# Patient Record
Sex: Female | Born: 1980 | Race: White | Hispanic: No | Smoking: Current every day smoker
Health system: Southern US, Community
[De-identification: ages and names within clinical notes are randomized; demographics above are authoritative.]

## PROBLEM LIST (undated history)

## (undated) DIAGNOSIS — C801 Malignant (primary) neoplasm, unspecified: Secondary | ICD-10-CM

## (undated) DIAGNOSIS — R009 Unspecified abnormalities of heart beat: Secondary | ICD-10-CM

## (undated) DIAGNOSIS — A0472 Enterocolitis due to Clostridium difficile, not specified as recurrent: Secondary | ICD-10-CM

## (undated) DIAGNOSIS — F319 Bipolar disorder, unspecified: Secondary | ICD-10-CM

## (undated) DIAGNOSIS — F419 Anxiety disorder, unspecified: Secondary | ICD-10-CM

## (undated) HISTORY — DX: Enterocolitis due to Clostridium difficile, not specified as recurrent: A04.72

## (undated) HISTORY — PX: ABDOMINAL HYSTERECTOMY: SHX81

## (undated) HISTORY — PX: BACK SURGERY: SHX140

---

## 2005-07-20 ENCOUNTER — Ambulatory Visit (HOSPITAL_COMMUNITY): Admission: RE | Admit: 2005-07-20 | Discharge: 2005-07-21 | Payer: Self-pay | Admitting: Neurosurgery

## 2006-05-25 ENCOUNTER — Ambulatory Visit (HOSPITAL_COMMUNITY): Admission: RE | Admit: 2006-05-25 | Discharge: 2006-05-26 | Payer: Self-pay | Admitting: Neurosurgery

## 2009-09-10 ENCOUNTER — Inpatient Hospital Stay (HOSPITAL_COMMUNITY): Admission: RE | Admit: 2009-09-10 | Discharge: 2009-09-11 | Payer: Self-pay | Admitting: Neurosurgery

## 2009-09-15 ENCOUNTER — Inpatient Hospital Stay (HOSPITAL_COMMUNITY): Admission: EM | Admit: 2009-09-15 | Discharge: 2009-09-17 | Payer: Self-pay | Admitting: Emergency Medicine

## 2009-09-18 ENCOUNTER — Inpatient Hospital Stay (HOSPITAL_COMMUNITY): Admission: AD | Admit: 2009-09-18 | Discharge: 2009-09-24 | Payer: Self-pay | Admitting: Neurosurgery

## 2009-12-06 ENCOUNTER — Inpatient Hospital Stay (HOSPITAL_COMMUNITY): Admission: AD | Admit: 2009-12-06 | Discharge: 2009-12-12 | Payer: Self-pay | Admitting: Neurosurgery

## 2009-12-09 ENCOUNTER — Ambulatory Visit: Payer: Self-pay | Admitting: Internal Medicine

## 2009-12-12 ENCOUNTER — Encounter: Payer: Self-pay | Admitting: Internal Medicine

## 2009-12-20 ENCOUNTER — Telehealth: Payer: Self-pay | Admitting: Internal Medicine

## 2009-12-20 ENCOUNTER — Emergency Department (HOSPITAL_COMMUNITY): Admission: EM | Admit: 2009-12-20 | Discharge: 2009-12-21 | Payer: Self-pay | Admitting: Emergency Medicine

## 2009-12-31 ENCOUNTER — Encounter: Payer: Self-pay | Admitting: Infectious Disease

## 2010-01-08 ENCOUNTER — Ambulatory Visit: Payer: Self-pay | Admitting: Internal Medicine

## 2010-01-08 DIAGNOSIS — Z8541 Personal history of malignant neoplasm of cervix uteri: Secondary | ICD-10-CM | POA: Insufficient documentation

## 2010-01-08 DIAGNOSIS — D649 Anemia, unspecified: Secondary | ICD-10-CM

## 2010-01-08 DIAGNOSIS — F329 Major depressive disorder, single episode, unspecified: Secondary | ICD-10-CM

## 2010-01-08 DIAGNOSIS — IMO0002 Reserved for concepts with insufficient information to code with codable children: Secondary | ICD-10-CM

## 2010-01-08 DIAGNOSIS — T8140XA Infection following a procedure, unspecified, initial encounter: Secondary | ICD-10-CM

## 2010-01-08 DIAGNOSIS — Z9889 Other specified postprocedural states: Secondary | ICD-10-CM

## 2010-01-08 DIAGNOSIS — F17200 Nicotine dependence, unspecified, uncomplicated: Secondary | ICD-10-CM

## 2010-01-23 ENCOUNTER — Telehealth: Payer: Self-pay | Admitting: Internal Medicine

## 2010-02-13 ENCOUNTER — Encounter: Payer: Self-pay | Admitting: Internal Medicine

## 2010-11-04 NOTE — Consult Note (Signed)
Summary: Consultation Report  Consultation Report   Imported By: Florinda Marker 03/17/2010 10:13:07  _____________________________________________________________________  External Attachment:    Type:   Image     Comment:   External Document

## 2010-11-04 NOTE — Progress Notes (Signed)
Summary: Pt. to go to [facility]. for [facility] evaluation  Phone Note Other Incoming   Caller: Dr. Orvan Falconer Summary of Call: Pt. reported to Dr. Orvan Falconer that her [facility] was not flushing after she had given herself her morning dose of vancomycin.  Dr. Orvan Falconer requested that this RN follow-up to see whether the pt. needed to come to the ED to have her [facility] line evaluated.  Jennet Maduro RN  December 20, 2009 12:01 PM  RN called the pt. who shared that same information that Dr. Orvan Falconer had told this RN.  RN told the pt. that she would contact the [facility], Stanton Kidney [phone]).   Jennet Maduro RN  December 20, 2009 12:01 PM  Phone call to Debra,RN.  Stanton Kidney actually made a visit to the pt's home this AM.  Stanton Kidney attempted to flush the [facility] without success.  Stanton Kidney believes that the pt. may not be flushing the [facility] before and after IV Vanc administration.  Pt. to go to the ED at [facility] to have the [facility] line evaluated, either declot or replacement.  Stanton Kidney, [facility] to call pt with this informaiton. Jennet Maduro RN  December 20, 2009 12:06 PM

## 2010-11-04 NOTE — Miscellaneous (Signed)
Summary: HIPAA Restrictions  HIPAA Restrictions   Imported By: Florinda Marker 01/09/2010 15:26:21  _____________________________________________________________________  External Attachment:    Type:   Image     Comment:   External Document

## 2010-11-04 NOTE — Miscellaneous (Signed)
Summary: [facility]: Orders  [facility]: Orders   Imported By: Florinda Marker 02/14/2010 16:43:12  _____________________________________________________________________  External Attachment:    Type:   Image     Comment:   External Document

## 2010-11-04 NOTE — Assessment & Plan Note (Signed)
Summary: [facility] NEED CHART/MRSA LUMBAR WOUND INF/KM   CC:  [facility].  History of Present Illness: [name] is in for her hospital follow-up visit.  She has degenerative disk disease and has undergone multiple lumbar surgeries.  She underwent a bilateral L5 laminectomy in December of 2010.  She required a dural tear repair during that procedure.  She did well until February when she began to have recurrent back pain and swelling of her wound.  She was readmitted and underwent incision and drainage on March 4.  Operative cultures grew MRSA.  She was discharged home on IV vancomycin through a PICC.  The line became clotted but this was cleared at [facility] and she's had no further problems with her IV vancomycin or the PICC.  She is feeling much better with decreased pain.  Preventive Screening-Counseling & Management  Alcohol-Tobacco     Smoking Status: current     Smoking Cessation Counseling: yes     Packs/Day: 0.5     Year Started: 1999  Caffeine-Diet-Exercise     Caffeine use/day: yes     Does Patient Exercise: no  Safety-Violence-Falls     Seat Belt Use: yes   Current Allergies (reviewed today): ! CODEINE ! MORPHINE ! DURAGESIC-100 (FENTANYL) ! STEROIDS ! VICODIN Past History:  Past Medical History: Cervical cancer, hx of Depression Anemia-NOS  Past Surgical History: Cholecystectomy Hysterectomy Lumbar laminectomy  Vital Signs:  Patient profile:   30 year old female Height:      67 inches (170.18 cm) Weight:      148.75 pounds (67.61 kg) BMI:     23.38 Temp:     97.9 degrees F (36.61 degrees C) oral Pulse rate:   80 / minute BP sitting:   117 / 78  (right arm) Cuff size:   regular  Vitals Entered By: Jennet Maduro RN (January 08, 2010 2:57 PM) CC: [facility] Is Patient Diabetic? No Pain Assessment Patient in pain? yes     Location: lower back Intensity: 5 Type: burning, sharp Onset of pain  Constant Nutritional Status BMI of 19 -24 =  normal Nutritional Status Detail appetite "OK"  Have you ever been in a relationship where you felt threatened, hurt or afraid?not asked   Does patient need assistance? Functional Status Self care Ambulation Normal   Physical Exam  General:  alert and overweight-appearing.  she appears much more comfortable than when in the hospital Msk:  her lumbar incision is healing well.  It is no longer large enough to receive any packing.   Impression & Recommendations:  Problem # 1:  OTHER POSTOPERATIVE INFECTION NEC (ICD-998.59) She is improving on IV vancomycin. She is currently in the 31 of therapy and will receive a full 6 weeks of treatment. Orders: Est. Patient Level III (16109)  Medications Added to Medication List This Visit: 1)  Vancomycin Hcl 1000 Mg Solr (Vancomycin hcl) .... Iv per protocol  Patient Instructions: 1)  Please schedule a follow-up appointment on 4/18.

## 2010-11-04 NOTE — Progress Notes (Signed)
Summary: Genevieve Norlander RN unable to meet with pt. needing PIC orders   Phone Note Other Incoming   Caller: Candyce Churn RN [phone] Summary of Call: [facility] RN unable to see or talk with pt. since Monday.  HHRN reported that there is a car parked in the driveway during the day and that the pt. states she has to pick up her father after work at 3:30 pm daily.  HHRN asked what Dr. Orvan Falconer wanted to do about the pt's PIC line.  HHRN has not been able to change the pt's PIC dressing this week due to the pt. not being home when [facility] came to the pt's home.  RN advised the [facility] to continue to attempt to meet the pt. and change the [facility] line dressing. This office will contact [facility] after obtaining orders from Dr. Orvan Falconer on Monday, April 25. Jennet Maduro RN  January 23, 2010 5:11 PM   Follow-up for Phone Call        She has missed two recent appointments and is not following IV vanco orders. The PICC needs to be puled and vanco stopped. Follow-up by: Cliffton Asters MD,  January 27, 2010 9:45 AM     Appended Document: Genevieve Norlander RN unable to meet with pt. needing PIC orders  Dr. Orvan Falconer asked that pt. be scheduled on Tuesday, April 26 @ 1500. RN contacted pt.  Pt. stated  that she would be able to keep appt.   Appended Document: Genevieve Norlander RN unable to meet with pt. needing PIC orders  Pt. unable to keep appt. today.  Dr. Orvan Falconer asked that the IV ABX be stopped and PIC be removed.  Phone call to [facility] RN.  Order given.

## 2010-12-28 LAB — COMPREHENSIVE METABOLIC PANEL
ALT: 8 U/L (ref 0–35)
AST: 16 U/L (ref 0–37)
Albumin: 2.3 g/dL — ABNORMAL LOW (ref 3.5–5.2)
Alkaline Phosphatase: 62 U/L (ref 39–117)
BUN: 2 mg/dL — ABNORMAL LOW (ref 6–23)
CO2: 33 mEq/L — ABNORMAL HIGH (ref 19–32)
Calcium: 8.8 mg/dL (ref 8.4–10.5)
Chloride: 104 mEq/L (ref 96–112)
Creatinine, Ser: 0.66 mg/dL (ref 0.4–1.2)
GFR calc Af Amer: >60 mL/min (ref >60)
GFR calc non Af Amer: >60 mL/min (ref >60)
Glucose, Bld: 86 mg/dL (ref 70–99)
Potassium: 4.3 mEq/L (ref 3.5–5.1)
Sodium: 143 mEq/L (ref 135–145)
Total Bilirubin: 0.2 mg/dL — ABNORMAL LOW (ref 0.3–1.2)
Total Protein: 6.5 g/dL (ref 6.0–8.3)

## 2010-12-28 LAB — WOUND CULTURE

## 2010-12-28 LAB — CBC
HCT: 29.1 % — ABNORMAL LOW (ref 36.0–46.0)
HCT: 30.2 % — ABNORMAL LOW (ref 36.0–46.0)
Hemoglobin: 10.1 g/dL — ABNORMAL LOW (ref 12.0–15.0)
Hemoglobin: 9.9 g/dL — ABNORMAL LOW (ref 12.0–15.0)
MCHC: 33.5 g/dL (ref 30.0–36.0)
MCHC: 33.9 g/dL (ref 30.0–36.0)
MCV: 83.4 fL (ref 78.0–100.0)
MCV: 84.5 fL (ref 78.0–100.0)
Platelets: 241 10*3/uL (ref 150–400)
Platelets: 296 10*3/uL (ref 150–400)
RBC: 3.49 MIL/uL — ABNORMAL LOW (ref 3.87–5.11)
RBC: 3.57 MIL/uL — ABNORMAL LOW (ref 3.87–5.11)
RDW: 13.5 % (ref 11.5–15.5)
RDW: 13.5 % (ref 11.5–15.5)
WBC: 4.8 10*3/uL (ref 4.0–10.5)
WBC: 7.9 10*3/uL (ref 4.0–10.5)

## 2010-12-28 LAB — DIFFERENTIAL
Basophils Absolute: 0.1 10*3/uL (ref 0.0–0.1)
Basophils Relative: 1 % (ref 0–1)
Eosinophils Absolute: 0 10*3/uL (ref 0.0–0.7)
Eosinophils Relative: 0 % (ref 0–5)
Lymphocytes Relative: 18 % (ref 12–46)
Lymphs Abs: 1.4 10*3/uL (ref 0.7–4.0)
Monocytes Absolute: 0.6 10*3/uL (ref 0.1–1.0)
Monocytes Relative: 8 % (ref 3–12)
Neutro Abs: 5.7 10*3/uL (ref 1.7–7.7)
Neutrophils Relative %: 73 % (ref 43–77)

## 2010-12-28 LAB — T4, FREE: Free T4: 1.16 ng/dL (ref 0.80–1.80)

## 2010-12-28 LAB — ANAEROBIC CULTURE

## 2010-12-28 LAB — SEDIMENTATION RATE: Sed Rate: 112 mm/hr — ABNORMAL HIGH (ref 0–22)

## 2010-12-28 LAB — VANCOMYCIN, TROUGH: Vancomycin Tr: 12.3 ug/mL (ref 10.0–20.0)

## 2010-12-28 LAB — TSH: TSH: 2.315 u[IU]/mL (ref 0.350–4.500)

## 2011-01-06 LAB — DIFFERENTIAL
Basophils Absolute: 0 10*3/uL (ref 0.0–0.1)
Basophils Relative: 1 % (ref 0–1)
Eosinophils Absolute: 0.2 10*3/uL (ref 0.0–0.7)
Eosinophils Relative: 4 % (ref 0–5)
Lymphocytes Relative: 37 % (ref 12–46)
Lymphs Abs: 1.7 10*3/uL (ref 0.7–4.0)
Monocytes Absolute: 0.4 10*3/uL (ref 0.1–1.0)
Monocytes Relative: 8 % (ref 3–12)
Neutro Abs: 2.4 10*3/uL (ref 1.7–7.7)
Neutrophils Relative %: 51 % (ref 43–77)

## 2011-01-06 LAB — CBC
HCT: 36.4 % (ref 36.0–46.0)
HCT: 38.3 % (ref 36.0–46.0)
Hemoglobin: 12.4 g/dL (ref 12.0–15.0)
Hemoglobin: 13.2 g/dL (ref 12.0–15.0)
MCHC: 34.1 g/dL (ref 30.0–36.0)
MCHC: 34.6 g/dL (ref 30.0–36.0)
MCV: 90.4 fL (ref 78.0–100.0)
MCV: 90.9 fL (ref 78.0–100.0)
Platelets: 149 10*3/uL — ABNORMAL LOW (ref 150–400)
Platelets: 190 10*3/uL (ref 150–400)
RBC: 4 MIL/uL (ref 3.87–5.11)
RBC: 4.23 MIL/uL (ref 3.87–5.11)
RDW: 12.7 % (ref 11.5–15.5)
RDW: 12.8 % (ref 11.5–15.5)
WBC: 4.6 10*3/uL (ref 4.0–10.5)
WBC: 5.3 10*3/uL (ref 4.0–10.5)

## 2011-01-06 LAB — BASIC METABOLIC PANEL
BUN: 5 mg/dL — ABNORMAL LOW (ref 6–23)
CO2: 26 mEq/L (ref 19–32)
Calcium: 9 mg/dL (ref 8.4–10.5)
Chloride: 107 mEq/L (ref 96–112)
Creatinine, Ser: 0.71 mg/dL (ref 0.4–1.2)
GFR calc Af Amer: >60 mL/min (ref >60)
GFR calc non Af Amer: >60 mL/min (ref >60)
Glucose, Bld: 89 mg/dL (ref 70–99)
Potassium: 3.4 mEq/L — ABNORMAL LOW (ref 3.5–5.1)
Sodium: 140 mEq/L (ref 135–145)

## 2011-01-06 LAB — POCT I-STAT 4, (NA,K, GLUC, HGB,HCT)
Glucose, Bld: 82 mg/dL (ref 70–99)
HCT: 45 % (ref 36.0–46.0)
Hemoglobin: 15.3 g/dL — ABNORMAL HIGH (ref 12.0–15.0)
Potassium: 3.9 mEq/L (ref 3.5–5.1)
Sodium: 142 mEq/L (ref 135–145)

## 2011-01-06 LAB — MRSA PCR SCREENING: MRSA by PCR: NEGATIVE

## 2011-02-03 ENCOUNTER — Other Ambulatory Visit: Payer: Self-pay | Admitting: Neurosurgery

## 2011-02-03 DIAGNOSIS — M549 Dorsalgia, unspecified: Secondary | ICD-10-CM

## 2011-02-03 DIAGNOSIS — M5410 Radiculopathy, site unspecified: Secondary | ICD-10-CM

## 2011-02-10 ENCOUNTER — Ambulatory Visit
Admission: RE | Admit: 2011-02-10 | Discharge: 2011-02-10 | Disposition: A | Discharge status: Home / Self Care | Payer: Medicaid Other | Source: Ambulatory Visit | Attending: Neurosurgery | Admitting: Neurosurgery

## 2011-02-10 DIAGNOSIS — M549 Dorsalgia, unspecified: Secondary | ICD-10-CM

## 2011-02-10 DIAGNOSIS — M5410 Radiculopathy, site unspecified: Secondary | ICD-10-CM

## 2011-02-12 ENCOUNTER — Encounter (HOSPITAL_COMMUNITY)
Admission: RE | Admit: 2011-02-12 | Discharge: 2011-02-12 | Disposition: A | Discharge status: Home / Self Care | Payer: Medicaid Other | Source: Ambulatory Visit | Attending: Neurosurgery | Admitting: Neurosurgery

## 2011-02-12 LAB — CBC
MCH: 27.6 pg (ref 26.0–34.0)
MCV: 82.5 fL (ref 78.0–100.0)
Platelets: 232 10*3/uL (ref 150–400)
RDW: 13.4 % (ref 11.5–15.5)
WBC: 4.3 10*3/uL (ref 4.0–10.5)

## 2011-02-13 ENCOUNTER — Inpatient Hospital Stay (HOSPITAL_COMMUNITY)
Admission: RE | Admit: 2011-02-13 | Discharge: 2011-02-19 | DRG: 460 | Disposition: A | Discharge status: Home / Self Care | Payer: Medicaid Other | Source: Ambulatory Visit | Attending: Neurosurgery | Admitting: Neurosurgery

## 2011-02-13 ENCOUNTER — Inpatient Hospital Stay (HOSPITAL_COMMUNITY): Payer: Medicaid Other

## 2011-02-13 DIAGNOSIS — M51379 Other intervertebral disc degeneration, lumbosacral region without mention of lumbar back pain or lower extremity pain: Secondary | ICD-10-CM | POA: Diagnosis present

## 2011-02-13 DIAGNOSIS — IMO0001 Reserved for inherently not codable concepts without codable children: Secondary | ICD-10-CM | POA: Diagnosis present

## 2011-02-13 DIAGNOSIS — Z9889 Other specified postprocedural states: Secondary | ICD-10-CM

## 2011-02-13 DIAGNOSIS — M5137 Other intervertebral disc degeneration, lumbosacral region: Secondary | ICD-10-CM | POA: Diagnosis present

## 2011-02-13 DIAGNOSIS — Z01812 Encounter for preprocedural laboratory examination: Secondary | ICD-10-CM

## 2011-02-13 DIAGNOSIS — M5126 Other intervertebral disc displacement, lumbar region: Principal | ICD-10-CM | POA: Diagnosis present

## 2011-02-13 DIAGNOSIS — F17200 Nicotine dependence, unspecified, uncomplicated: Secondary | ICD-10-CM | POA: Diagnosis present

## 2011-02-17 NOTE — Op Note (Signed)
[facility], Gloria Hawkins          ACCOUNT NO.:  [id number]  MEDICAL RECORD NO.:  [id number]           PATIENT TYPE:  I  LOCATION:  3039                         FACILITY:  [facility]  PHYSICIAN:  Hilda Lias, M.D.   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  PROCEDURE:  Bilateral L4-L5 diskectomy, facetectomy.  Interbody fusion with cages, pedicle screws from L4 to L5, posterolateral arthrodesis with Osteocel and autograft plus bone marrow aspiration, Cell Saver, C- arm.  CLINICAL HISTORY:  The patient is a 30 year old female who in the past underwent two lumbar procedures.  Nevertheless, since December 2011, she is complaining of back pain, worsened to both legs.  She came to see me on February 02, 2011 cm and because of the findings, we went ahead and did a lumbar myelogram which shows severe stenosis at the level of L4-5 secondary to a large herniated disk.  The thecal sac was displaced all the way down to the left side.  Because of the disk, this would need a surgical procedure, I talked to her about the possibility of proceeding with not only diskectomy, but also fusion.  The risk was explained to her including the possibility of need for surgery, infection, and CSF leak.  PROCEDURE:  The patient was taken to the OR and after intubation, she was positioned in prone manner.  The back was cleaned with DuraPrep. Previous scar tissue was removed.  Then, incision was carried out all the way until we found the area of the lumbar spine.  Because of the previous surgery, it was difficult to see any good anatomy. Nevertheless, we did an incision in the midline.  We removed part of the spinous  process and x-rays showed indeed we were at the L4-5.  The patient had quite a bit of adhesion and lysis was accomplished.  Then, we went laterally.  Then, with the scar tissue, it was difficult to retract the thecal sac.  Nevertheless to get into the disk space, we had to do an L4 facetectomy and do a diskectomy beyond that normally require to decompress the L4 and L5 nerve root.  Having done this, we entered the disk space and large amount of degenerative disks were removed, not only from the left but also from the right side.  The implant was removed and then using a mix of bone marrow aspiration, Osteocel and autograft, two cages of 12 x 22 were inserted.  The bone marrow aspiration was drained after we made a hole in the pedicle of L4-5. With the C-arm, we were able to visualize the pedicle of L4 and L5. The drill was used and we introduced 4 screws of 6.5 x 45 at the level of L4 and L5 on the left side and 5.5 x 45 at the level of L5 on the right side.  Although, we did a C-arm during the procedure.  Nevertheless, we had to  readjust both of the screws at the level of L5.  Then, at the area using the drill, we drilled the lateral aspect of the facet of L4 bilaterally and a mix of Osteocel, bone marrow aspiration as well as autograft was used for arthrodesis.  The pedicle screws were connected with a rod and kept in place with caps.  Then, we went back again.  We checked the L4 and L5 nerve root and there was plenty of space for both of them.  Valsalva maneuver at 2:40 was negative.  Then, the area was irrigated and closed with Vicryl and staples.          ______________________________ Hilda Lias, M.D.     EB/MEDQ  D:  02/13/2011  T:  02/14/2011  Job:  [id number]  Electronically Signed by Hilda Lias M.D. on 02/17/2011 01:41:16 PM

## 2011-02-17 NOTE — H&P (Signed)
  NAMEFRANCELLA, BARNETT          ACCOUNT NO.:  [id number]  MEDICAL RECORD NO.:  [id number]           PATIENT TYPE:  I  LOCATION:  3039                         FACILITY:  [facility]  PHYSICIAN:  Hilda Lias, M.D.   DATE OF BIRTH:  02/26/1981  DATE OF ADMISSION:  02/13/2011 DATE OF DISCHARGE:                             HISTORY & PHYSICAL   Ms. Knapper is a lady who in the past underwent decompressive laminectomy in the lumbar spine.  The patient had been followed by me in my office.  I have not seen her quite a bit of a long time, and suddenly, she came at the end of April of this year in a wheelchair complaining of back pain that has been going on from since December 2011.  She was quite miserable.  Physical examination was almost impossible secondary to the amount of pain she was having.  We did an outpatient myelogram and because of findings she is being admitted today as urgent decompression and fusion.  PAST MEDICAL HISTORY:  Lumbar laminectomy.  Anxiety.  She has breast reduction, tumor ligation, hysterectomy.  Diskectomy at the level of 3-4 and 4-5 twice.  ALLERGIES:  She is allergic to VICODIN, DARVOCET, AND STEROID.  MEDICATIONS:  She is taking Percocet.  FAMILY HISTORY:  Negative.  PHYSICAL EXAMINATION:  GENERAL:  The patient came to my office with her grandmother, she is in a wheelchair. HEAD, EARS, NOSE AND THROAT:  Normal. NECK:  Normal. LUNGS:  There is some mild rhonchi bilaterally. HEART:  Sounds normal. ABDOMEN:  Normal. EXTREMITIES:  Normal pulse. NEURO:  Mental status normal.  Cranial nerves normal.  Strength, she has a weakened dorsiflexion with both feet.  Sensation, she complains of numbness of both legs.  The rest of the physical examination was impossible to do secondary to the amount of pain she is having.  The myelogram is quite impressive, this lady has large herniated disk with displacement of the thecal sac all the way to the left.   Almost 30% of the canal is completely full of a herniated disk.  CLINICAL IMPRESSION:  L4-5 herniated disk.  RECOMMENDATIONS:  The patient is being admitted for surgery.  This is going to be her 3rd surgical procedure and I told her that because of the amount of herniated disk and the narrow disk space, we are going to proceed with not only with decompression, but also fusion.  She knows the risks of such infection, CSF leak, worsening pain, paralysis, need for surgery.          ______________________________ Hilda Lias, M.D.     EB/MEDQ  D:  02/13/2011  T:  02/13/2011  Job:  [id number]  Electronically Signed by Hilda Lias M.D. on 02/17/2011 01:41:13 PM

## 2011-02-20 NOTE — H&P (Signed)
NAMEMICHELLE, Hawkins           ACCOUNT NO.:  [id number]   MEDICAL RECORD NO.:  [id number]          PATIENT TYPE:  AMB   LOCATION:  SDS                          FACILITY:  [facility]   PHYSICIAN:  Hilda Lias, M.D.   DATE OF BIRTH:  [birth year]   DATE OF ADMISSION:  05/25/2006  DATE OF DISCHARGE:                                HISTORY & PHYSICAL   The patient had been seen by me in my office because of back and left leg  pain.  This lady in the past underwent surgery at the level 4, 5 on the  right side.  Now for the past few months, she has been complaining of back  pain with radiation down the left leg to the point that she barely can move.  It is quite painful for her to go up and down stairs.   PAST MEDICAL HISTORY:  1. Hysterectomy.  2. Surgery on the left hand.  3. L4-5 diskectomy on the right side.   ALLERGIES:  1. CODEINE.  2. VICODIN.  3. __________  .  4. MORPHINE.   SOCIAL HISTORY:  She smokes.  She drinks beer.   FAMILY HISTORY:  __________  have a history of diabetes, high blood  pressure.   REVIEW OF SYSTEMS:  Positive for difficulty urinating, back pain, anxiety,  depression.   PHYSICAL EXAMINATION:  GENERAL:  The patient came to my office limping on  the left leg.  HEENT:  Normal.  NECK:  Normal.  LUNGS:  Clear.  HEART:  Sounds normal.  ABDOMEN:  Normal.  NEUROLOGIC:  She is normal except that she has a weakness of the left  quadriceps.  She had difficulty going up and down stairs.  Femoral stretch  maneuver is positive.   The MRI showed that she has a herniated disk at the level 3-4 on the left  side.  The area of 4-5 is completely normal.   CLINICAL IMPRESSION:  Left L3-L4 herniated disk.   The patient is being admitted for surgery.  She knows of the risks such as  infection, CSF leak, worsening pain, paralysis, no improvement whatsoever,  need for further surgery.           ______________________________  Hilda Lias, M.D.     EB/MEDQ  D:  05/25/2006  T:  05/25/2006  Job:  [id number]

## 2011-02-20 NOTE — Op Note (Signed)
Gloria [facility], GIEBEL           ACCOUNT NO.:  [id number]   MEDICAL RECORD NO.:  [id number]          PATIENT TYPE:  OIB   LOCATION:  3014                         FACILITY:  [facility]   PHYSICIAN:  Hilda Lias, M.D.   DATE OF BIRTH:  29-Aug-1981   DATE OF PROCEDURE:  05/25/2006  DATE OF DISCHARGE:                                 OPERATIVE REPORT   PREOPERATIVE DIAGNOSIS:  Left L3-L4 herniated disk.   POSTOPERATIVE DIAGNOSIS:  Left L3-L4 herniated disk.   PROCEDURE:  Left L3-L4 diskectomy, foraminotomy, decompression of the L3-L4  nerve root microscopically.   SURGEON:  Dr. Jeral Fruit.   HISTORY:  The patient was admitted because of back and left leg pain.  She  is 30 years old with a history of carcinoma of the cervix.  She had previous  surgery at the level of 4-5 on the right side.  She is not better.  Surgery  was advised.   PROCEDURE:  The patient was taken to the OR and she was positioned in a  prone manner.  X-ray showed that were in at the level of L4.  The area was  prepped with DuraPrep.  Drapes were applied.  Medial incision from L3 to L4  was made and muscle retracted laterally.  With the microscope with the level  of L3 and L4.  The ligamentous muscles were excised.  We repeated the x-ray  and we were at the level of 3-4. Decompression of the L3 nerve root was done  and indeed there was a herniated disk centrally and laterally.  Incision was  made and a large amount of degenerative disk medial and lateral was  accomplished.  The patient had osteophyte ridge which was also removed.  At  the end we had good decompression.  Valsalva maneuver was negative.  From  there on, the area was irrigated.  The wound was closed with Vicryl and  Steri-strips.           ______________________________  Hilda Lias, M.D.     EB/MEDQ  D:  05/25/2006  T:  05/26/2006  Job:  [id number]

## 2011-02-20 NOTE — Op Note (Signed)
Gloria [facility], Gloria Hawkins           ACCOUNT NO.:  [id number]   MEDICAL RECORD NO.:  [id number]          PATIENT TYPE:  OIB   LOCATION:  2852                         FACILITY:  [facility]   PHYSICIAN:  Hilda Lias, M.D.   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   PROCEDURE:  1.  Right L4-5 diskectomy.  2.  Decompression of the L4-5 nerve root.  3.  Microscope.   SURGEON:  Dr. Jeral Fruit   CLINICAL HISTORY:  The patient for several months had been complaining of  back pain which included right leg.  The patient failed with conservative  treatment.  She had been seen by the pain clinic with no improvement.  MRI  showed that indeed she had a herniated disk at L4-5.  She has a bulging disk  center 5-1.  Surgery was advised, and the risks were explained to the  patient, including the possibility of nonhealing, the chronicity of the  pain, infection, CSF leak, need for further surgery.   DESCRIPTION OF PROCEDURE:  The patient was taken to the OR, and she was  positioned in prone manner.  Because of her obesity, it was difficult to  palpate the spinous process.  Nevertheless, an incision was made in the  midline, and it was carried down to the fascia.  The fascia was retracted.  X-ray showed indeed we were at the L4-5.  With the microscope within the  lamina of L4 and up at the L5.  The yellow ligament was also excised.  We  found that the nerve root was displaced laterally and posterior.  There was  a large, calcified herniated disk mostly at the level of the axilla.  Lysis  was accomplished.  Retraction was done, and we were able to retract the L5  nerve root.  Incision was made, and large amount of degenerative disk was  removed.  From then on, after we had more space, we were able to work right  at the level of the axilla.  More calcified disk was removed.  There was an  arachnoid pouch, and it was taken care of with a single stitch of Prolene.  Investigation of the L4 nerve root was clean.  At the end, we had good  compression.  Valsalva maneuver was negative.  Tisseel was left in the  epidural space, and the wound was closed with Vicryl and Steri-Strips.  Nevertheless, we decided to use 4-0 nylon in the skin.  The patient is going  to go to PACU and most likely will be discharged in 24 hours.           ______________________________  Hilda Lias, M.D.     EB/MEDQ  D:  07/20/2005  T:  07/20/2005  Job:  [id number]

## 2011-02-24 NOTE — Discharge Summary (Signed)
  Gloria Hawkins, Gloria Hawkins          ACCOUNT NO.:  [id number]  MEDICAL RECORD NO.:  [id number]           PATIENT TYPE:  I  LOCATION:  3039                         FACILITY:  [facility]  PHYSICIAN:  Hilda Lias, M.D.   DATE OF BIRTH:  1980-12-12  DATE OF ADMISSION:  02/13/2011 DATE OF DISCHARGE:  02/19/2011                              DISCHARGE SUMMARY   ADMISSION DIAGNOSES: 1. Large herniated disk at the level of lumbar 4-5 with a central     stenosis. 2. Degenerative disk disease. 3. Acute radiculopathy. 4. Status post several lumbar procedures.  DISCHARGE DIAGNOSES: 1. Large herniated disk at the level of lumbar 4-5 with a central     stenosis. 2. Degenerative disk disease. 3. Acute radiculopathy. 4. Status post several lumbar procedures.  [name] is a lady who came to see me in my office, complaining of back pain that has been going on since December 2011.  The patient came by wheelchair.  The patient previously had surgery in the lumbar spine.  Because of the finding, we went ahead and we sent her to have myelogram.  The myelogram came back as a large herniated disk with severe stenosis at the level of L4-5.  Because the patient had several surgeries in the past, she was brought to the hospital for decompression or fusion.  Laboratory within normal limits.  COURSE IN THE HOSPITAL:  The patient was taken to the surgery and lysis of adhesion, facetectomy, bilateral L4-5 diskectomy with decompression of the thecal sac as was well as the L4-5 nerve root was done followed by cages and screws.  The patient had been, after surgery, complaining of muscle spasm, no weakness, and pain going to the right leg, but no evidence of any weakness.  The wound was draining a few days, but now is completely dry.  Today, she wants to go home because she is feeling better and because tomorrow is her birthday.  CONDITION ON DISCHARGE:  Improved.  MEDICATIONS:  She is on oxycodone  for pain and Valium for muscle spasm.  DIET:  Regular.  ACTIVITY:  She is not to drive.  She is not to do any heavy lifting. She was encouraged to walk.  FOLLOWUP:  She will be seen in my office in 10 days to have her staples out.          ______________________________ Hilda Lias, M.D.     EB/MEDQ  D:  02/19/2011  T:  02/20/2011  Job:  [id number]  Electronically Signed by Hilda Lias M.D. on 02/24/2011 11:05:37 AM

## 2011-02-26 ENCOUNTER — Inpatient Hospital Stay (HOSPITAL_COMMUNITY): Payer: Medicaid Other

## 2011-02-26 ENCOUNTER — Other Ambulatory Visit (HOSPITAL_COMMUNITY): Payer: Medicaid Other

## 2011-02-26 ENCOUNTER — Inpatient Hospital Stay (HOSPITAL_COMMUNITY)
Admission: AD | Admit: 2011-02-26 | Discharge: 2011-03-11 | DRG: 920 | Disposition: A | Discharge status: Home / Self Care | Payer: Medicaid Other | Source: Ambulatory Visit | Attending: Neurosurgery | Admitting: Neurosurgery

## 2011-02-26 DIAGNOSIS — K5289 Other specified noninfective gastroenteritis and colitis: Secondary | ICD-10-CM | POA: Diagnosis present

## 2011-02-26 DIAGNOSIS — J811 Chronic pulmonary edema: Secondary | ICD-10-CM | POA: Diagnosis not present

## 2011-02-26 DIAGNOSIS — IMO0002 Reserved for concepts with insufficient information to code with codable children: Principal | ICD-10-CM | POA: Diagnosis present

## 2011-02-26 DIAGNOSIS — A0472 Enterocolitis due to Clostridium difficile, not specified as recurrent: Secondary | ICD-10-CM

## 2011-02-26 DIAGNOSIS — Z981 Arthrodesis status: Secondary | ICD-10-CM

## 2011-02-26 DIAGNOSIS — Y838 Other surgical procedures as the cause of abnormal reaction of the patient, or of later complication, without mention of misadventure at the time of the procedure: Secondary | ICD-10-CM | POA: Diagnosis present

## 2011-02-26 HISTORY — DX: Enterocolitis due to Clostridium difficile, not specified as recurrent: A04.72

## 2011-02-26 LAB — URINE MICROSCOPIC-ADD ON

## 2011-02-26 LAB — GRAM STAIN

## 2011-02-26 LAB — DIFFERENTIAL
Eosinophils Relative: 0 % (ref 0–5)
Monocytes Relative: 3 % (ref 3–12)
Neutrophils Relative %: 95 % — ABNORMAL HIGH (ref 43–77)
WBC Morphology: INCREASED

## 2011-02-26 LAB — CBC
HCT: 26.2 % — ABNORMAL LOW (ref 36.0–46.0)
MCV: 79.4 fL (ref 78.0–100.0)
RBC: 3.3 MIL/uL — ABNORMAL LOW (ref 3.87–5.11)
WBC: 15.1 10*3/uL — ABNORMAL HIGH (ref 4.0–10.5)

## 2011-02-26 LAB — URINALYSIS, ROUTINE W REFLEX MICROSCOPIC
Glucose, UA: NEGATIVE mg/dL
Ketones, ur: 15 mg/dL — AB
Protein, ur: 100 mg/dL — AB

## 2011-02-26 LAB — COMPREHENSIVE METABOLIC PANEL
Albumin: 2.5 g/dL — ABNORMAL LOW (ref 3.5–5.2)
Alkaline Phosphatase: 97 U/L (ref 39–117)
BUN: 11 mg/dL (ref 6–23)
Chloride: 88 mEq/L — ABNORMAL LOW (ref 96–112)
Glucose, Bld: 124 mg/dL — ABNORMAL HIGH (ref 70–99)
Potassium: 3.5 mEq/L (ref 3.5–5.1)
Total Bilirubin: 0.7 mg/dL (ref 0.3–1.2)

## 2011-02-26 LAB — SEDIMENTATION RATE: Sed Rate: 95 mm/hr — ABNORMAL HIGH (ref 0–22)

## 2011-02-27 ENCOUNTER — Inpatient Hospital Stay (HOSPITAL_COMMUNITY): Payer: Medicaid Other

## 2011-02-27 DIAGNOSIS — E871 Hypo-osmolality and hyponatremia: Secondary | ICD-10-CM

## 2011-02-27 DIAGNOSIS — R6520 Severe sepsis without septic shock: Secondary | ICD-10-CM

## 2011-02-27 DIAGNOSIS — A419 Sepsis, unspecified organism: Secondary | ICD-10-CM

## 2011-02-27 DIAGNOSIS — I498 Other specified cardiac arrhythmias: Secondary | ICD-10-CM

## 2011-02-27 DIAGNOSIS — R6521 Severe sepsis with septic shock: Secondary | ICD-10-CM

## 2011-02-27 LAB — CARDIAC PANEL(CRET KIN+CKTOT+MB+TROPI)
CK, MB: 1.4 ng/mL (ref 0.3–4.0)
Relative Index: 0.2 (ref 0.0–2.5)
Troponin I: <0.3 ng/mL (ref <0.30)

## 2011-02-27 LAB — CBC
Hemoglobin: 6.3 g/dL — CL (ref 12.0–15.0)
Hemoglobin: 8.6 g/dL — ABNORMAL LOW (ref 12.0–15.0)
MCH: 27.1 pg (ref 26.0–34.0)
MCH: 27.4 pg (ref 26.0–34.0)
MCH: 28.3 pg (ref 26.0–34.0)
MCHC: 33.8 g/dL (ref 30.0–36.0)
MCV: 80.1 fL (ref 78.0–100.0)
MCV: 79.1 fL (ref 78.0–100.0)
Platelets: 165 10*3/uL (ref 150–400)
Platelets: 126 10*3/uL — ABNORMAL LOW (ref 150–400)
RBC: 2.3 MIL/uL — ABNORMAL LOW (ref 3.87–5.11)
RBC: 3.04 MIL/uL — ABNORMAL LOW (ref 3.87–5.11)
RDW: 14.6 % (ref 11.5–15.5)
WBC: 10.4 10*3/uL (ref 4.0–10.5)
WBC: 8.5 10*3/uL (ref 4.0–10.5)
WBC: 13.3 10*3/uL — ABNORMAL HIGH (ref 4.0–10.5)

## 2011-02-27 LAB — CARBOXYHEMOGLOBIN
Methemoglobin: 1 % (ref 0.0–1.5)
Total hemoglobin: 6.4 g/dL — CL (ref 12.5–16.0)

## 2011-02-27 LAB — BASIC METABOLIC PANEL
BUN: 17 mg/dL (ref 6–23)
CO2: 19 mEq/L (ref 19–32)
Calcium: 6 mg/dL — CL (ref 8.4–10.5)
Creatinine, Ser: 1.08 mg/dL (ref 0.4–1.2)
GFR calc non Af Amer: 59 mL/min — ABNORMAL LOW (ref >60)
Glucose, Bld: 146 mg/dL — ABNORMAL HIGH (ref 70–99)

## 2011-02-27 LAB — D-DIMER, QUANTITATIVE: D-Dimer, Quant: 4.5 ug/mL-FEU — ABNORMAL HIGH (ref 0.00–0.48)

## 2011-02-27 LAB — DIFFERENTIAL
Basophils Relative: 0 % (ref 0–1)
Eosinophils Relative: 2 % (ref 0–5)
Lymphocytes Relative: 4 % — ABNORMAL LOW (ref 12–46)
Neutrophils Relative %: 90 % — ABNORMAL HIGH (ref 43–77)

## 2011-02-27 LAB — CORTISOL: Cortisol, Plasma: 16.5 ug/dL

## 2011-02-27 LAB — GLUCOSE, CAPILLARY: Glucose-Capillary: 103 mg/dL — ABNORMAL HIGH (ref 70–99)

## 2011-02-27 LAB — DRUGS OF ABUSE SCREEN W/O ALC, ROUTINE URINE
Amphetamine Screen, Ur: NEGATIVE
Barbiturate Quant, Ur: NEGATIVE
Cocaine Metabolites: NEGATIVE
Marijuana Metabolite: POSITIVE — AB
Opiate Screen, Urine: NEGATIVE
Phencyclidine (PCP): NEGATIVE

## 2011-02-27 LAB — COMPREHENSIVE METABOLIC PANEL
AST: 78 U/L — ABNORMAL HIGH (ref 0–37)
BUN: 16 mg/dL (ref 6–23)
CO2: 23 mEq/L (ref 19–32)
Chloride: 92 mEq/L — ABNORMAL LOW (ref 96–112)
Creatinine, Ser: 1.14 mg/dL (ref 0.4–1.2)
GFR calc Af Amer: >60 mL/min (ref >60)
GFR calc non Af Amer: 56 mL/min — ABNORMAL LOW (ref >60)
Total Bilirubin: 0.9 mg/dL (ref 0.3–1.2)

## 2011-02-27 LAB — BLOOD GAS, ARTERIAL
Acid-base deficit: 2 mmol/L (ref 0.0–2.0)
FIO2: 0.21 %
pCO2 arterial: 27.3 mmHg — ABNORMAL LOW (ref 35.0–45.0)
pO2, Arterial: 89 mmHg (ref 80.0–100.0)

## 2011-02-27 LAB — CLOSTRIDIUM DIFFICILE BY PCR

## 2011-02-27 LAB — URINE CULTURE: Culture  Setup Time: 201205241917

## 2011-02-28 ENCOUNTER — Inpatient Hospital Stay (HOSPITAL_COMMUNITY): Payer: Medicaid Other

## 2011-02-28 DIAGNOSIS — I059 Rheumatic mitral valve disease, unspecified: Secondary | ICD-10-CM

## 2011-02-28 DIAGNOSIS — J9600 Acute respiratory failure, unspecified whether with hypoxia or hypercapnia: Secondary | ICD-10-CM

## 2011-02-28 LAB — BLOOD GAS, ARTERIAL
Acid-base deficit: 10.1 mmol/L — ABNORMAL HIGH (ref 0.0–2.0)
Acid-base deficit: 11.4 mmol/L — ABNORMAL HIGH (ref 0.0–2.0)
Acid-base deficit: 8.8 mmol/L — ABNORMAL HIGH (ref 0.0–2.0)
Bicarbonate: 15.1 mEq/L — ABNORMAL LOW (ref 20.0–24.0)
Bicarbonate: 16.9 mEq/L — ABNORMAL LOW (ref 20.0–24.0)
Drawn by: 330991
Drawn by: 281201
FIO2: 1 %
FIO2: 0.9 %
MECHVT: 500 mL
MECHVT: 370 mL
O2 Content: 3 L/min
O2 Saturation: 97 %
PEEP: 20 cmH2O
PEEP: 20 cmH2O
Patient temperature: 98.6
Patient temperature: 98.6
RATE: 35 resp/min
TCO2: 16.2 mmol/L (ref 0–100)
TCO2: 18.1 mmol/L (ref 0–100)
pCO2 arterial: 29.2 mmHg — ABNORMAL LOW (ref 35.0–45.0)
pCO2 arterial: 39.2 mmHg (ref 35.0–45.0)
pCO2 arterial: 38.9 mmHg (ref 35.0–45.0)
pH, Arterial: 7.192 — CL (ref 7.350–7.400)
pO2, Arterial: 64.1 mmHg — ABNORMAL LOW (ref 80.0–100.0)
pO2, Arterial: 176 mmHg — ABNORMAL HIGH (ref 80.0–100.0)

## 2011-02-28 LAB — COMPREHENSIVE METABOLIC PANEL
AST: 102 U/L — ABNORMAL HIGH (ref 0–37)
Albumin: 1.5 g/dL — ABNORMAL LOW (ref 3.5–5.2)
Alkaline Phosphatase: 100 U/L (ref 39–117)
CO2: 18 mEq/L — ABNORMAL LOW (ref 19–32)
Chloride: 101 mEq/L (ref 96–112)
GFR calc Af Amer: >60 mL/min (ref >60)
GFR calc non Af Amer: >60 mL/min (ref >60)
Potassium: 3.7 mEq/L (ref 3.5–5.1)
Total Bilirubin: 3.6 mg/dL — ABNORMAL HIGH (ref 0.3–1.2)

## 2011-02-28 LAB — TYPE AND SCREEN
ABO/RH(D): O POS
Antibody Screen: NEGATIVE
Unit division: 0

## 2011-02-28 LAB — DIC (DISSEMINATED INTRAVASCULAR COAGULATION)PANEL
D-Dimer, Quant: 8.79 ug/mL-FEU — ABNORMAL HIGH (ref 0.00–0.48)
INR: 1.31 (ref 0.00–1.49)
Smear Review: NONE SEEN

## 2011-02-28 LAB — CBC
MCH: 28 pg (ref 26.0–34.0)
MCV: 79.4 fL (ref 78.0–100.0)
Platelets: 156 10*3/uL (ref 150–400)
RBC: 3.39 MIL/uL — ABNORMAL LOW (ref 3.87–5.11)
RDW: 15.3 % (ref 11.5–15.5)
WBC: 18.8 10*3/uL — ABNORMAL HIGH (ref 4.0–10.5)

## 2011-02-28 LAB — CARBOXYHEMOGLOBIN
Methemoglobin: 0.6 % (ref 0.0–1.5)
Total hemoglobin: 8.8 g/dL — ABNORMAL LOW (ref 12.5–16.0)

## 2011-02-28 LAB — CARDIAC PANEL(CRET KIN+CKTOT+MB+TROPI)
CK, MB: 13.7 ng/mL (ref 0.3–4.0)
Total CK: 521 U/L — ABNORMAL HIGH (ref 7–177)
Troponin I: 4.38 ng/mL (ref <0.30)

## 2011-02-28 LAB — DIFFERENTIAL
Basophils Absolute: 0 10*3/uL (ref 0.0–0.1)
Eosinophils Relative: 1 % (ref 0–5)
Lymphocytes Relative: 8 % — ABNORMAL LOW (ref 12–46)
Monocytes Relative: 6 % (ref 3–12)
Neutrophils Relative %: 68 % (ref 43–77)
WBC Morphology: INCREASED

## 2011-02-28 LAB — BASIC METABOLIC PANEL
CO2: 17 mEq/L — ABNORMAL LOW (ref 19–32)
GFR calc non Af Amer: >60 mL/min (ref >60)
Glucose, Bld: 175 mg/dL — ABNORMAL HIGH (ref 70–99)
Potassium: 3.3 mEq/L — ABNORMAL LOW (ref 3.5–5.1)
Sodium: 128 mEq/L — ABNORMAL LOW (ref 135–145)

## 2011-02-28 LAB — GLUCOSE, CAPILLARY
Glucose-Capillary: 182 mg/dL — ABNORMAL HIGH (ref 70–99)
Glucose-Capillary: 179 mg/dL — ABNORMAL HIGH (ref 70–99)

## 2011-02-28 LAB — LACTIC ACID, PLASMA: Lactic Acid, Venous: 2 mmol/L (ref 0.5–2.2)

## 2011-02-28 LAB — CALCIUM, IONIZED: Calcium, Ion: 0.94 mmol/L — ABNORMAL LOW (ref 1.12–1.32)

## 2011-03-01 ENCOUNTER — Inpatient Hospital Stay (HOSPITAL_COMMUNITY): Payer: Medicaid Other

## 2011-03-01 DIAGNOSIS — I214 Non-ST elevation (NSTEMI) myocardial infarction: Secondary | ICD-10-CM

## 2011-03-01 LAB — BASIC METABOLIC PANEL
BUN: 13 mg/dL (ref 6–23)
CO2: 24 mEq/L (ref 19–32)
Calcium: 6.3 mg/dL — CL (ref 8.4–10.5)
Chloride: 101 mEq/L (ref 96–112)
Creatinine, Ser: 0.47 mg/dL (ref 0.4–1.2)
GFR calc Af Amer: >60 mL/min (ref >60)
GFR calc Af Amer: >60 mL/min (ref >60)
GFR calc non Af Amer: >60 mL/min (ref >60)
Glucose, Bld: 166 mg/dL — ABNORMAL HIGH (ref 70–99)
Potassium: 3.2 mEq/L — ABNORMAL LOW (ref 3.5–5.1)
Sodium: 128 mEq/L — ABNORMAL LOW (ref 135–145)

## 2011-03-01 LAB — BLOOD GAS, ARTERIAL
Acid-base deficit: 3 mmol/L — ABNORMAL HIGH (ref 0.0–2.0)
Bicarbonate: 22.2 mEq/L (ref 20.0–24.0)
FIO2: 0.7 %
MECHVT: 370 mL
TCO2: 23.6 mmol/L (ref 0–100)
pCO2 arterial: 45.4 mmHg — ABNORMAL HIGH (ref 35.0–45.0)
pO2, Arterial: 68.9 mmHg — ABNORMAL LOW (ref 80.0–100.0)

## 2011-03-01 LAB — CK TOTAL AND CKMB (NOT AT ARMC)
CK, MB: 9.1 ng/mL (ref 0.3–4.0)
Relative Index: INVALID (ref 0.0–2.5)
Total CK: 77 U/L (ref 7–177)

## 2011-03-01 LAB — CBC
HCT: 26.6 % — ABNORMAL LOW (ref 36.0–46.0)
RDW: 15.4 % (ref 11.5–15.5)
WBC: 22.3 10*3/uL — ABNORMAL HIGH (ref 4.0–10.5)

## 2011-03-01 LAB — GLUCOSE, CAPILLARY
Glucose-Capillary: 169 mg/dL — ABNORMAL HIGH (ref 70–99)
Glucose-Capillary: 176 mg/dL — ABNORMAL HIGH (ref 70–99)
Glucose-Capillary: 156 mg/dL — ABNORMAL HIGH (ref 70–99)

## 2011-03-01 LAB — WOUND CULTURE

## 2011-03-01 LAB — TROPONIN I: Troponin I: 1.77 ng/mL (ref <0.30)

## 2011-03-01 LAB — PHOSPHORUS
Phosphorus: 1.5 mg/dL — ABNORMAL LOW (ref 2.3–4.6)
Phosphorus: 1.8 mg/dL — ABNORMAL LOW (ref 2.3–4.6)

## 2011-03-01 LAB — CALCIUM, IONIZED: Calcium, Ion: 1.01 mmol/L — ABNORMAL LOW (ref 1.12–1.32)

## 2011-03-01 LAB — HEPATITIS PANEL, ACUTE: Hep B C IgM: NEGATIVE

## 2011-03-01 LAB — MAGNESIUM: Magnesium: 1.8 mg/dL (ref 1.5–2.5)

## 2011-03-02 ENCOUNTER — Inpatient Hospital Stay (HOSPITAL_COMMUNITY): Payer: Medicaid Other

## 2011-03-02 DIAGNOSIS — R197 Diarrhea, unspecified: Secondary | ICD-10-CM

## 2011-03-02 LAB — CBC
HCT: 23.8 % — ABNORMAL LOW (ref 36.0–46.0)
MCH: 27.8 pg (ref 26.0–34.0)
MCHC: 34.9 g/dL (ref 30.0–36.0)
MCV: 79.6 fL (ref 78.0–100.0)
Platelets: 103 10*3/uL — ABNORMAL LOW (ref 150–400)
RDW: 15.8 % — ABNORMAL HIGH (ref 11.5–15.5)

## 2011-03-02 LAB — BASIC METABOLIC PANEL
BUN: 12 mg/dL (ref 6–23)
Calcium: 6.4 mg/dL — CL (ref 8.4–10.5)
Creatinine, Ser: <0.47 mg/dL (ref 0.4–1.2)
Glucose, Bld: 143 mg/dL — ABNORMAL HIGH (ref 70–99)
Potassium: 3.7 mEq/L (ref 3.5–5.1)

## 2011-03-02 LAB — GLUCOSE, CAPILLARY
Glucose-Capillary: 140 mg/dL — ABNORMAL HIGH (ref 70–99)
Glucose-Capillary: 162 mg/dL — ABNORMAL HIGH (ref 70–99)
Glucose-Capillary: 137 mg/dL — ABNORMAL HIGH (ref 70–99)

## 2011-03-02 LAB — BLOOD GAS, ARTERIAL
Acid-Base Excess: 0.4 mmol/L (ref 0.0–2.0)
Drawn by: 23604
FIO2: 0.7 %
MECHVT: 370 mL
O2 Saturation: 91.9 %
Patient temperature: 98.6
RATE: 35 resp/min
TCO2: 26.8 mmol/L (ref 0–100)
pCO2 arterial: 47.1 mmHg — ABNORMAL HIGH (ref 35.0–45.0)

## 2011-03-02 LAB — MAGNESIUM: Magnesium: 2 mg/dL (ref 1.5–2.5)

## 2011-03-02 NOTE — Consult Note (Signed)
Gloria Hawkins, Gloria Hawkins          ACCOUNT NO.:  [id number]  MEDICAL RECORD NO.:  [id number]           PATIENT TYPE:  I  LOCATION:  3101                         FACILITY:  [facility]  PHYSICIAN:  Thana Farr, MD    DATE OF BIRTH:  January 02, 1981  DATE OF CONSULTATION:  02/28/2011 DATE OF DISCHARGE:                                CONSULTATION   REFERRING PHYSICIAN:  Hilda Lias, MD  HISTORY:  Gloria Hawkins is a 30 year old female, status post L4- 5 fusion secondary to herniated disk and spinal stenosis with resultant infection related to the same who was placed on antibiotics secondary to her infection.  The patient went into shock on Feb 27, 2011.  Was hypotensive and hypoxemic.  Now intubated with the pulmonary picture consistent with ARDS.  Noted this morning to have jerky movements consistent with myoclonus.  Consult was called for further recommendations. With further investigation, it seems that the patient was hypoxemic with PO2 in the 30s for a short period of time.  Prior to that time, though, was hypoxemic but less so.  PAST MEDICAL HISTORY: 1. Breast reduction. 2. Tubal ligation. 3. Hysterectomy. 4. Discectomy. 5. Chronic pain. 6. Depression. 7. Anxiety.  SOCIAL HISTORY:  The patient had a positive urine drug screen for marijuana.  She smokes half pack a day of cigarettes.  She denies any alcohol abuse.  MEDICATIONS:  Mycolog, vancomycin, Flagyl, Zosyn, aspirin, Protonix, Solu-Cortef, Peridex, Versed, fentanyl, Valium, and Zofran.  PHYSICAL EXAMINATION:  VITAL SIGNS:  Blood pressure 105/80, heart rate 108, respiratory rate 28, T-max 98.3. GENERAL:  The patient has received fentanyl and Versed prior to evaluation. NEUROLOGIC:  On mental status testing, the patient is unresponsive to deep sternal rub.  She does not follow commands.  There is no speech. On cranial nerve testing, pupils are equal and reactive to light bilaterally.  There is no doll eyes response  bilaterally.  There are weak corneal responses bilaterally.  On motor testing, there is no extremity movement noted.  The patient is flaccid throughout.  On sensory testing, the patient does not respond to noxious stimuli in extremities.  Deep tendon reflexes are absent throughout.  Plantars are mute bilaterally.  Cerebellar testing unable to perform.  LABORATORY DATA:  White blood cell count of 18.8, platelet count 160, hemoglobin/hematocrit 9.5 and 26.9 respectively.  ESR 95.  PT/INR and PTT 16.5, 1.31, and 34 respectively.  Sodium 129, potassium 3.7, chloride 101, bicarb 18, BUN and creatinine 19 and 0.77 respectively, glucose 148.  Bilirubin 3.6, alk phos 100, SGOT and SGPT 102 and 62 respectively, protein 4.8 albumin 1.5.  Calcium 6.0, phosphorus 2.7. Magnesium 1.0, CK 521.  C. diff positive.  ASSESSMENT:  Gloria Hawkins is a 30 year old female noted to have involuntary movements.  Prior to sedation, nursing did note the patient to be purposely moving with the use of both upper extremities in an attempt to remove her ET tube.  She has no movements at this time.  It is unclear if the movements are seen earlier were actually myoclonus or may have been related to her multiple metabolic issues as opposed to anoxic event.  Further workup is  indicated.  PLAN: 1. EEG. 2. Head CT. 3. Agree with addressing metabolic issues. 4. We will continue to follow with you.          ______________________________ Thana Farr, MD     LR/MEDQ  D:  02/28/2011  T:  03/01/2011  Job:  [id number]  Electronically Signed by Thana Farr MD on 03/02/2011 06:43:41 PM

## 2011-03-03 ENCOUNTER — Inpatient Hospital Stay (HOSPITAL_COMMUNITY): Payer: Medicaid Other

## 2011-03-03 DIAGNOSIS — A419 Sepsis, unspecified organism: Secondary | ICD-10-CM

## 2011-03-03 DIAGNOSIS — A0472 Enterocolitis due to Clostridium difficile, not specified as recurrent: Secondary | ICD-10-CM

## 2011-03-03 DIAGNOSIS — J984 Other disorders of lung: Secondary | ICD-10-CM

## 2011-03-03 DIAGNOSIS — J9600 Acute respiratory failure, unspecified whether with hypoxia or hypercapnia: Secondary | ICD-10-CM

## 2011-03-03 DIAGNOSIS — R6521 Severe sepsis with septic shock: Secondary | ICD-10-CM

## 2011-03-03 LAB — CBC
MCH: 27.4 pg (ref 26.0–34.0)
MCHC: 33.9 g/dL (ref 30.0–36.0)
MCV: 80.7 fL (ref 78.0–100.0)
Platelets: 105 10*3/uL — ABNORMAL LOW (ref 150–400)
RBC: 2.85 MIL/uL — ABNORMAL LOW (ref 3.87–5.11)

## 2011-03-03 LAB — MAGNESIUM: Magnesium: 1.9 mg/dL (ref 1.5–2.5)

## 2011-03-03 LAB — BASIC METABOLIC PANEL
BUN: 15 mg/dL (ref 6–23)
CO2: 29 mEq/L (ref 19–32)
Calcium: 6.5 mg/dL — ABNORMAL LOW (ref 8.4–10.5)
Chloride: 98 mEq/L (ref 96–112)
Creatinine, Ser: <0.47 mg/dL (ref 0.4–1.2)

## 2011-03-03 LAB — ANAEROBIC CULTURE

## 2011-03-03 LAB — GLUCOSE, CAPILLARY: Glucose-Capillary: 159 mg/dL — ABNORMAL HIGH (ref 70–99)

## 2011-03-04 ENCOUNTER — Inpatient Hospital Stay (HOSPITAL_COMMUNITY): Payer: Medicaid Other

## 2011-03-04 DIAGNOSIS — A419 Sepsis, unspecified organism: Secondary | ICD-10-CM

## 2011-03-04 DIAGNOSIS — R6520 Severe sepsis without septic shock: Secondary | ICD-10-CM

## 2011-03-04 LAB — CULTURE, BLOOD (ROUTINE X 2)
Culture  Setup Time: 201205242121
Culture: NO GROWTH
Culture: NO GROWTH

## 2011-03-04 LAB — BENZODIAZEPINE, QUANTITATIVE, URINE
Alprazolam (GC/LC/MS), ur confirm: NEGATIVE NG/ML
Flurazepam GC/MS Conf: NEGATIVE NG/ML
Lorazepam UR QT: NEGATIVE NG/ML
Oxazepam GC/MS Conf: 330 NG/ML — ABNORMAL HIGH
Temazepam GC/MS Conf: 364 NG/ML — ABNORMAL HIGH

## 2011-03-04 LAB — GLUCOSE, CAPILLARY
Glucose-Capillary: 118 mg/dL — ABNORMAL HIGH (ref 70–99)
Glucose-Capillary: 156 mg/dL — ABNORMAL HIGH (ref 70–99)
Glucose-Capillary: 157 mg/dL — ABNORMAL HIGH (ref 70–99)

## 2011-03-04 LAB — CBC
HCT: 28.3 % — ABNORMAL LOW (ref 36.0–46.0)
Hemoglobin: 9.5 g/dL — ABNORMAL LOW (ref 12.0–15.0)
MCV: 81.6 fL (ref 78.0–100.0)
RBC: 3.47 MIL/uL — ABNORMAL LOW (ref 3.87–5.11)
WBC: 17.1 10*3/uL — ABNORMAL HIGH (ref 4.0–10.5)

## 2011-03-04 LAB — BASIC METABOLIC PANEL
BUN: 16 mg/dL (ref 6–23)
Chloride: 111 mEq/L (ref 96–112)
Glucose, Bld: 156 mg/dL — ABNORMAL HIGH (ref 70–99)
Potassium: 3.5 mEq/L (ref 3.5–5.1)
Sodium: 148 mEq/L — ABNORMAL HIGH (ref 135–145)

## 2011-03-04 NOTE — Op Note (Signed)
  NAMEHAELI, Gloria Hawkins          ACCOUNT NO.:  [id number]  MEDICAL RECORD NO.:  [id number]           PATIENT TYPE:  I  LOCATION:  3101                         FACILITY:  [facility]  PHYSICIAN:  Hilda Lias, M.D.   DATE OF BIRTH:  Jun 02, 1981  DATE OF PROCEDURE:  02/26/2011 DATE OF DISCHARGE:                              OPERATIVE REPORT   PREOPERATIVE DIAGNOSIS:  Rule out lumbar wound infection.  POSTOPERATIVE DIAGNOSES:  Rule out lumbar wound infection.  PROCEDURE:  Incision and drainage of lumbar wound.  SURGEON:  Hilda Lias, MD  CLINICAL HISTORY:  The patient is a 30 year old female who underwent fusion at the level of L4-L5.  The patient was discharged 8 days after being completely afebrile.  Today, she comes to the office complaining of fever of 104 for almost 4 days associated with chills, nausea, vomiting, and diarrhea.  We brought her to the [facility], we have done an MRI.  The MRI showed the possibility of seroma, but no evidence of infection.  I was called by Dr. Shirlean Kelly, my partner, because of tachycardia, hypotensive.  To be sure that we were not missing any infection, we brought her to the operating room for exploration of the lumbar wound.  PROCEDURE IN DETAIL:  The patient was taken to the OR and after intubation, she was positioned in prone manner.  The back was cleaned with DuraPrep and the previous stitches were removed.  Incision was made and immediately what we found underneath was some seroma.  There was no evidence of any infection.  The fascia was completely intact.  We did a Valsalva maneuver and there was no evidence of any pus coming under the fascia.  We irrigated the area.  Prior to that, specimen was sent to the laboratory.  Having ruled out the possibility of infection, we closed the wound using Ethicon in single stitches.  She is going to go to the PACU and from then to the neurosurgical intensive care unit.   During surgery, we found that she had a lot of marks in her veins and I do not know if this is secondary to phlebitis or the previous intravenous fluid or something else.  Nevertheless, we are going to obtain a drug screen test on her.          ______________________________ Hilda Lias, M.D.     EB/MEDQ  D:  02/26/2011  T:  02/27/2011  Job:  [id number]  Electronically Signed by Hilda Lias M.D. on 03/04/2011 07:15:06 PM

## 2011-03-04 NOTE — H&P (Signed)
NAMEAMAZIN, PINCOCK          ACCOUNT NO.:  [id number]  MEDICAL RECORD NO.:  [id number]           PATIENT TYPE:  I  LOCATION:  3101                         FACILITY:  [facility]  PHYSICIAN:  Hilda Lias, M.D.   DATE OF BIRTH:  05-Apr-1981  DATE OF ADMISSION:  02/26/2011 DATE OF DISCHARGE:                             HISTORY & PHYSICAL   [name] is a lady who underwent L4-5 fusion because of central herniated disk secondary to severe stenosis of the lumbar spine.  After surgery, I kept the patient 8 days in the hospital.  The patient was doing well and according to rehab doctor she was not a candidate to go to the Rehab unit.  She was completely afebrile.  The pain that she complained after surgery was some discomfort in the lumbar area with minimal drainage.  By the time she was discharged, she had been without any drainage 48 hours and with no fever, whatsoever.  Today, she came to my office and she came in a wheelchair.  She was brought by a friend telling me that her temperature had been going to 104.9 for the past 2-3 days associated with nausea, vomiting, and diarrhea.  There is no question that while she was here, she vomited on 2 occasions and she had diarrhea.  She was quite miserable.  PAST MEDICAL HISTORY:  Lumbar laminectomy with fusion.  She had a breast reduction, tubal ligation, hysterectomy, diskectomy at the level of 3-4 and 4-5 twice.  ALLERGIES:  She is allergic to: 1. VICODIN. 2. DARVOCET. 3. CORTISONE. 4. Now, she is allergic to MORPHINE.  MEDICATIONS:  She is taking Percocet.  FAMILY HISTORY:  Unremarkable.  PHYSICAL EXAMINATION:  GENERAL:  The patient came to my office in a wheelchair.  Obviously, this lady was acute sick, vomiting and she vomited at least three times while she was in my examining room. HEAD, EARS, NOSE, AND THROAT:  Normal. NECK:  Normal. LUNGS:  There are some mild rhonchi bilaterally. CARDIOVASCULAR:   Unremarkable. ABDOMEN:  Unremarkable. EXTREMITIES:  Normal pulses. NEURO:  She is oriented x3.  Cranial nerves are normal.  I cannot find any weakness, difficult to assess because of the pain that she is having. SKIN:  The wound is completely dry.  There is no evidence of any drainage.  There is increase of her temperature on all over her body. There is slightly increase of edema and erythema the lumbar wound.  CLINICAL IMPRESSION:  Rule out infection, rule out virus infection, rule out lumbar wound infection.  RECOMMENDATION:  I am going to transfer Ms. Rego-Lockamy immediately to [facility].  She is going to have IV fluids.  We are going to give some pain medication and do a workup to rule out infection.  Also, we are going to obtain MRI of the lumbar spine.  Once I have the result, I will see them and decide what to do next.          ______________________________ Hilda Lias, M.D.     EB/MEDQ  D:  02/26/2011  T:  02/27/2011  Job:  [id number]  Electronically Signed by Hilda Lias M.D. on  03/04/2011 07:15:04 PM

## 2011-03-05 ENCOUNTER — Inpatient Hospital Stay (HOSPITAL_COMMUNITY): Payer: Medicaid Other

## 2011-03-05 LAB — GLUCOSE, CAPILLARY
Glucose-Capillary: 113 mg/dL — ABNORMAL HIGH (ref 70–99)
Glucose-Capillary: 106 mg/dL — ABNORMAL HIGH (ref 70–99)

## 2011-03-05 LAB — BASIC METABOLIC PANEL
BUN: 19 mg/dL (ref 6–23)
BUN: 16 mg/dL (ref 6–23)
CO2: 34 mEq/L — ABNORMAL HIGH (ref 19–32)
Calcium: 7.2 mg/dL — ABNORMAL LOW (ref 8.4–10.5)
Chloride: 113 mEq/L — ABNORMAL HIGH (ref 96–112)
Creatinine, Ser: 0.47 mg/dL (ref 0.4–1.2)
Creatinine, Ser: 0.47 mg/dL (ref 0.4–1.2)
GFR calc Af Amer: >60 mL/min (ref >60)
GFR calc non Af Amer: >60 mL/min (ref >60)
GFR calc non Af Amer: >60 mL/min (ref >60)
Glucose, Bld: 87 mg/dL (ref 70–99)
Sodium: 148 mEq/L — ABNORMAL HIGH (ref 135–145)

## 2011-03-05 LAB — CULTURE, BLOOD (ROUTINE X 2)
Culture  Setup Time: 201205251320
Culture: NO GROWTH

## 2011-03-05 LAB — PHOSPHORUS: Phosphorus: 2.1 mg/dL — ABNORMAL LOW (ref 2.3–4.6)

## 2011-03-05 NOTE — Consult Note (Signed)
Gloria Hawkins, Gloria Hawkins          ACCOUNT NO.:  [id number]  MEDICAL RECORD NO.:  [id number]           PATIENT TYPE:  LOCATION:                                 FACILITY:  PHYSICIAN:  Armanda Magic, M.D.     DATE OF BIRTH:  [birth year]  DATE OF CONSULTATION: DATE OF DISCHARGE:                                CONSULTATION   REFERRING PHYSICIAN:  Kalman Shan, MD  CHIEF COMPLAINT:  Pulmonary edema, positive cardiac enzymes.  HISTORY OF PRESENT ILLNESS:  This is a 30 year old female with a history of recent L4-L5 fusion secondary to central herniated disk, secondary to severe lumbar stenosis and went home with rehab at home.  She presented to Neurosurgery office on Feb 26, 2011, with complaints of fever up to 104 degrees with nausea, vomiting, and diaphoresis.  She was admitted to the hospital and underwent MRI showing possible seroma, but no infection.  She underwent exploratory surgery for possible lumbar wound infection, but there was no evidence of infection, although the seroma did grow out in one culture with gram positive cocci.  She was started on vancomycin, gentamicin, and Zosyn.  She developed shock yesterday and was placed on pressors.  Procalcitonin was 10.13.  It was felt that she was in septic shock.  Chest x-ray this a.m. showed new onset bilateral airspace disease.  She developed worsening metabolic acidosis and hypoxemia and had to be intubated.  We are now asked to consult secondary to elevated cardiac enzymes.  PAST SURGICAL HISTORY: 1. 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 2. Chronic pain. 3. Morbid obesity. 4. Depression and anxiety. 5. Cervical CA, status post total abdominal hysterectomy in 2004,  status post cholecystectomy in 2001. 6. Tobacco abuse.  ALLERGIES: 1. CODEINE. 2. VICODIN. 3. FENTANYL. 4. MORPHINE. 5. PREDNISONE.  FAMILY HISTORY:  Significant for diabetes in her grandparents.  SOCIAL HISTORY:  She is single and lives with her parents.  She has a 78- year-old son.  She has a 11-year history of tobacco use, 1/2-1 pack of cigarettes daily and she denies any alcohol or IV drug use.  REVIEW OF SYSTEMS:  Otherwise as stated in the HPI is negative.  Cannot get a review of systems, the patient is intubated.  MEDICATIONS:  She was on admission with Percocet.  She currently is on gentamicin, vancomycin, Zosyn, and Flagyl and she is currently on IV vasopressin for pressure support.  PHYSICAL EXAMINATION:  VITAL SIGNS:  Systolic blood pressure 144 mmHg. Heart rate 135 beats per minute. GENERAL:  This is a ill-appearing white female, intubated. HEENT:  Benign. NECK:  Supple without lymphadenopathy. LUNGS:  Clear to auscultation anteriorly. HEART:  Tachycardic.  No murmurs, rubs, or gallops. ABDOMEN:  Soft, nontender, and nondistended.  Normoactive bowel sounds. No hepatosplenomegaly. EXTREMITIES:  No cyanosis, erythema, or edema.  LABORATORY DATA:  Sodium  is 129, potassium 3.7, bicarb 18, BUN 19, and creatinine 0.77.  White cell count 148.  AST 102, ALT 62, calcium 6. INR 1.33.  Lactic acid 2.9.  Procalcitonin 11.85 and D-dimer 8.79.  CPK 521, MB 13.7, relative index 2.6, troponin 4.38.  Magnesium 1.  Chest x- ray shows vascular congestion with bibasilar perihilar airspace disease consistent with CHF versus ARDS.  EKG done this morning at 5:56 shows sinus tachycardia at 111 beats per minute with no ST-T wave abnormalities.  ASSESSMENT: 1. Septic shock. 2. Acute respiratory failure secondary to congestive heart failure     versus acute respiratory distress syndrome. 3. Elevated cardiac enzymes, questionable etiology, coronary ischemia     versus myocarditis but  most likley due to supply demand mismatch from sepsis.       coronary ischemia is less likely given the patient's age. 4. Elevated D-dimer of questionable significance in the setting of     sepsis. 5. Recent lumbar diskectomy.  PLAN:  Continue to cycle cardiac enzymes.  We will continue pressure support.  Stat 2-D echocardiogram.  Consider placement of Swan-Ganz catheter.     Armanda Magic, M.D.     TT/MEDQ  D:  02/28/2011  T:  02/28/2011  Job:  [id number]  Electronically Signed by Armanda Magic M.D. on 03/05/2011 04:18:34 PM

## 2011-03-06 ENCOUNTER — Inpatient Hospital Stay (HOSPITAL_COMMUNITY): Payer: Medicaid Other

## 2011-03-06 LAB — BASIC METABOLIC PANEL
Calcium: 7.1 mg/dL — ABNORMAL LOW (ref 8.4–10.5)
Sodium: 145 mEq/L (ref 135–145)

## 2011-03-06 LAB — CBC
MCHC: 32.9 g/dL (ref 30.0–36.0)
RDW: 17.2 % — ABNORMAL HIGH (ref 11.5–15.5)

## 2011-03-06 LAB — GLUCOSE, CAPILLARY: Glucose-Capillary: 98 mg/dL (ref 70–99)

## 2011-03-07 LAB — CBC
MCHC: 32.2 g/dL (ref 30.0–36.0)
MCV: 85 fL (ref 78.0–100.0)
Platelets: 191 10*3/uL (ref 150–400)
RDW: 17.4 % — ABNORMAL HIGH (ref 11.5–15.5)
WBC: 7.8 10*3/uL (ref 4.0–10.5)

## 2011-03-07 LAB — GLUCOSE, CAPILLARY: Glucose-Capillary: 95 mg/dL (ref 70–99)

## 2011-03-07 LAB — BASIC METABOLIC PANEL
BUN: 11 mg/dL (ref 6–23)
Calcium: 7.5 mg/dL — ABNORMAL LOW (ref 8.4–10.5)
Potassium: 3.3 mEq/L — ABNORMAL LOW (ref 3.5–5.1)
Sodium: 142 mEq/L (ref 135–145)

## 2011-03-08 DIAGNOSIS — A0472 Enterocolitis due to Clostridium difficile, not specified as recurrent: Secondary | ICD-10-CM

## 2011-03-08 DIAGNOSIS — J9600 Acute respiratory failure, unspecified whether with hypoxia or hypercapnia: Secondary | ICD-10-CM

## 2011-03-08 DIAGNOSIS — M7989 Other specified soft tissue disorders: Secondary | ICD-10-CM

## 2011-03-08 LAB — BASIC METABOLIC PANEL
CO2: 34 mEq/L — ABNORMAL HIGH (ref 19–32)
Calcium: 8.5 mg/dL (ref 8.4–10.5)
GFR calc Af Amer: >60 mL/min (ref >60)
GFR calc non Af Amer: >60 mL/min (ref >60)
Sodium: 138 mEq/L (ref 135–145)

## 2011-03-08 LAB — CBC
Hemoglobin: 9.2 g/dL — ABNORMAL LOW (ref 12.0–15.0)
MCHC: 32.2 g/dL (ref 30.0–36.0)
Platelets: 207 10*3/uL (ref 150–400)

## 2011-03-09 ENCOUNTER — Inpatient Hospital Stay (HOSPITAL_COMMUNITY): Payer: Medicaid Other

## 2011-03-10 LAB — GLUCOSE, CAPILLARY: Glucose-Capillary: 116 mg/dL — ABNORMAL HIGH (ref 70–99)

## 2011-03-14 LAB — WOUND CULTURE

## 2011-03-16 NOTE — Discharge Summary (Signed)
NAMEJACQUI, Gloria Hawkins          ACCOUNT NO.:  [id number]  MEDICAL RECORD NO.:  [id number]  LOCATION:  3036                         FACILITY:  [facility]  PHYSICIAN:  Hilda Lias, M.D.   DATE OF BIRTH:  [birth year]  DATE OF ADMISSION:  02/26/2011 DATE OF DISCHARGE:  03/11/2011                              DISCHARGE SUMMARY   ADMISSION DIAGNOSES: 1. Rule out lumbar wound infection. 2. Rule out sepsis. 3. Rule out septic shock.  FINAL DIAGNOSES: 1. Rule out lumbar wound infection. 2. Rule out sepsis. 3. Rule out septic shock.  CLINICAL HISTORY:  The patient was admitted through my office.  The patient has had lumbar diskectomy with fusion.  Prior to being seen in my office, she came with a full day of temperature going to 109, nausea, vomiting, diarrhea up to the point that she had 3 episode of severe vomiting and diarrhea in my office.  She has swelling of the wound. Temperature was 104.  Because of that, we decided her to bring to the hospital immediately for evaluation.  LABORATORY DATA:  The urine culture was negative.  The blood culture was negative.  The stool was positive for C-difficile.  The MRI of the lumbar spine was negative for infection.  COURSE IN THE HOSPITAL:  The patient was admitted to the floor.  That night she became quite tachycardic.  We brought her to the operating room and disk closure was negative.  The patient had mostly a seroma. Because of the vital signs, she was transferred to the ICU.  She was seen by the Critical Care Medicine Service.  The patient was started on antibiotics.  Later on, she developed pulmonary edema.  She was intubated.  In the meantime, she was treated for enterocolitis.  The patient remained in the unit for 5 days.  Now, she is stable, she is ambulating, and she has no pain.  She has not had diarrhea for 48 hours. The last antibiotic was yesterday.  Since yesterday, she wants to go home.  I was able to convince her  yesterday and her parents to stay at least today.  Today, she had called to the nursing last several times because she is ready to go home.  By the time I came to see her, she was ready and packed.  She is going to be discharged but I am going to be sure that the Critical Care Medicine agrees before she is being discharged.  CONDITION ON DISCHARGE:  Improvement.  MEDICATION:  Oxycodone for pain.  She asked me for fentanyl patch and I told her that I do not use fentanyl patch as an outpatient.  This has to be done by the Pain Clinic.  DIET:  Regular.  ACTIVITY:  Not to drive, not to do any lifting.  FOLLOWUP:  She is to see me in my office in 10 days or before as needed. Again, the patient will be discharged provided that the Critical Care Medicine agree with the discharge.          ______________________________ Hilda Lias, M.D.     EB/MEDQ  D:  03/11/2011  T:  03/12/2011  Job:  [id number]  Electronically Signed by Hilda Lias M.D. on  03/16/2011 10:39:21 AM

## 2011-03-24 ENCOUNTER — Ambulatory Visit (INDEPENDENT_AMBULATORY_CARE_PROVIDER_SITE_OTHER): Payer: Medicaid Other | Admitting: Internal Medicine

## 2011-03-24 ENCOUNTER — Encounter: Payer: Self-pay | Admitting: Internal Medicine

## 2011-03-24 DIAGNOSIS — T8140XA Infection following a procedure, unspecified, initial encounter: Secondary | ICD-10-CM

## 2011-03-24 DIAGNOSIS — A0472 Enterocolitis due to Clostridium difficile, not specified as recurrent: Secondary | ICD-10-CM

## 2011-03-24 MED ORDER — METRONIDAZOLE 500 MG PO TABS: 500.0000 mg | ORAL_TABLET | Freq: Three times a day (TID) | ORAL | Status: DC

## 2011-03-24 MED ORDER — CEPHALEXIN 500 MG PO CAPS: 500.0000 mg | ORAL_CAPSULE | Freq: Four times a day (QID) | ORAL | Status: AC

## 2011-03-24 MED ORDER — FLUCONAZOLE 100 MG PO TABS: 100.0000 mg | ORAL_TABLET | ORAL | Status: DC

## 2011-03-24 NOTE — Assessment & Plan Note (Signed)
It appears that she has a persistent MSSA lumbar wound infection. I cannot be actually certain if it is early superficial. I will start her on oral Keflex and see her back in one week. I will have her stop the Levaquin. Her

## 2011-03-24 NOTE — Progress Notes (Signed)
  Subjective:    Patient ID: Gloria Hawkins, female    DOB: Feb 08, 1981, 30 y.o.   MRN: [id number]  HPI Gloria Hawkins is a 30 year old was referred to me by Dr. Jeral Fruit for evaluation of a lumbar wound infection. She is well known to me because of the MRSA lumbar wound infection she had a little over one year ago. She recovered from that eventually and was readmitted to the hospital on May 11 of this year and underwent L4-5 discectomy and fusion. She was discharged on May 17 but readmitted on May 24 with a temperature of 104 and wound drainage. A CT scan showed a fluid collection and she underwent incision and drainage of what was thought to be a seroma on May 25 the fluid collection did not travel beneath the fascia. Operative cultures grew abundant methicillin sensitive staph aureus. She developed septic shock and respiratory failure. She also developed C. difficile colitis. She was treated with broad-spectrum antibiotics and slowly improved. She was discharged on June 6. Antibiotics had been stopped before discharge. A wound culture was done on the day of discharge and again showed methicillin sensitive staph aureus. She states that the wound was loosely stitched closed on discharge but she had the stitches removed by a wound care nurse. She was seen back by Dr. Jeral Fruit 6 days ago and was started on Levaquin which she is still taking. She has not had any recurrent fever or diarrhea.     Review of Systems     Objective:   Physical Exam  Constitutional: No distress.       She is overweight.  HENT:  Mouth/Throat: Oropharynx is clear and moist. No oropharyngeal exudate.  Cardiovascular: Normal rate, regular rhythm and normal heart sounds.   No murmur heard. Pulmonary/Chest: Breath sounds normal. She has no wheezes. She has no rales.  Musculoskeletal:       She has an open lumbar wound that measures about 3" x 1.5" in diameter. It is about 90% red granulation tissue. There is no visible bone.  The wound tracks [place] and [place]. There is also a small opening about the size of a dime inferior to the larger wound. These 2 openings are contiguous. There is a large amount of yellow drainage on the gauze dressing packed in the wound. There is no surrounding cellulitis.          Assessment & Plan:

## 2011-03-24 NOTE — Assessment & Plan Note (Signed)
Given that she will restart Keflex today, she will be a very high risk for relapse of C. difficile colitis. I will start her on oral Flagyl prophylactically. She does not drink alcohol and does not have to worry about that potential interaction.

## 2011-03-31 ENCOUNTER — Encounter: Payer: Self-pay | Admitting: Internal Medicine

## 2011-03-31 ENCOUNTER — Ambulatory Visit (INDEPENDENT_AMBULATORY_CARE_PROVIDER_SITE_OTHER): Payer: Medicaid Other | Admitting: Internal Medicine

## 2011-03-31 ENCOUNTER — Other Ambulatory Visit: Payer: Self-pay | Admitting: *Deleted

## 2011-03-31 DIAGNOSIS — T8140XA Infection following a procedure, unspecified, initial encounter: Secondary | ICD-10-CM

## 2011-03-31 DIAGNOSIS — A0472 Enterocolitis due to Clostridium difficile, not specified as recurrent: Secondary | ICD-10-CM

## 2011-03-31 MED ORDER — METRONIDAZOLE 500 MG PO TABS: 500.0000 mg | ORAL_TABLET | Freq: Three times a day (TID) | ORAL | Status: AC

## 2011-03-31 NOTE — Progress Notes (Signed)
  Subjective:    Patient ID: Gloria Hawkins, female    DOB: 03-Jul-1981, 30 y.o.   MRN: [id number]  HPI Gloria Hawkins is in for her followup visit. She was able to get her Keflex, Flagyl and probiotic and started after her visit with me last week. She has not had any trouble tolerating her new medications and has not had any recurrent diarrhea. She states that her home nurses are changing her gauze dressing daily and have told her that the wound is getting smaller.    Review of Systems     Objective:   Physical Exam  Constitutional: No distress.  Musculoskeletal:       She has a dry gauze dressing over her lumbar wound. The gauze is covered in yellow drainage. The bed of the wound is beefy red and granulating well. The wound measures 0.50" x 1.25" and about 1 inch deep.          Assessment & Plan:

## 2011-03-31 NOTE — Assessment & Plan Note (Signed)
Her lumbar wound infection is improving and the wound is getting smaller. So far she is tolerating her Keflex well without any evidence of relapse of her C. difficile colitis. I will continue her current regimen

## 2011-04-14 ENCOUNTER — Ambulatory Visit (INDEPENDENT_AMBULATORY_CARE_PROVIDER_SITE_OTHER): Payer: Medicaid Other | Admitting: Internal Medicine

## 2011-04-14 ENCOUNTER — Encounter: Payer: Self-pay | Admitting: Internal Medicine

## 2011-04-14 DIAGNOSIS — T8140XA Infection following a procedure, unspecified, initial encounter: Secondary | ICD-10-CM

## 2011-04-14 NOTE — Assessment & Plan Note (Signed)
Her lumbar wound is getting better very slowly. I will have her continue her current antibiotics and have asked her to try to improve her nutrition. She has been able to gain 8 pounds since discharge.

## 2011-04-14 NOTE — Progress Notes (Signed)
  Subjective:    Patient ID: Gloria Hawkins, female    DOB: June 09, 1981, 30 y.o.   MRN: [id number]  HPI Gloria Hawkins is in for her routine followup visit. She continues to take her Keflex, Flagyl and probiotics without difficulty. She has not had any further diarrhea. She does note a little bit of abdominal bloating after eating. She states that her back pain is better and that the nurses tell her her lumbar wound is getting smaller by about 1 cm per week.    Review of Systems     Objective:   Physical Exam  Constitutional: No distress.  Musculoskeletal:       She has a dry gauze dressing over her lumbar wound. The gauze is covered in yellow drainage. The bed of the wound is beefy red and granulating well. The wound measures 0.50" x 1.25" and about 1 inch deep, the same size as it was on her last visit. A small opening just below her main wound has now closed.  Serial cellphones pictures that she has of the wound do show that it has gotten smaller since she left the hospital.          Assessment & Plan:

## 2011-04-22 ENCOUNTER — Other Ambulatory Visit: Payer: Self-pay | Admitting: Licensed Clinical Social Worker

## 2011-04-22 DIAGNOSIS — T8140XA Infection following a procedure, unspecified, initial encounter: Secondary | ICD-10-CM

## 2011-04-22 MED ORDER — FLUCONAZOLE 100 MG PO TABS: 100.0000 mg | ORAL_TABLET | ORAL | Status: DC

## 2011-05-05 ENCOUNTER — Encounter: Payer: Self-pay | Admitting: Internal Medicine

## 2011-05-05 ENCOUNTER — Ambulatory Visit (INDEPENDENT_AMBULATORY_CARE_PROVIDER_SITE_OTHER): Payer: Medicaid Other | Admitting: Internal Medicine

## 2011-05-05 DIAGNOSIS — T8140XA Infection following a procedure, unspecified, initial encounter: Secondary | ICD-10-CM

## 2011-05-05 NOTE — Assessment & Plan Note (Signed)
I suspect that her lumbar wound infection is now healed. Since she has not been able to take her full dose of her medication I will have her stop Keflex completely and observe off antibiotics.

## 2011-05-05 NOTE — Progress Notes (Signed)
  Subjective:    Patient ID: Gloria Hawkins, female    DOB: 12-Oct-1980, 30 y.o.   MRN: [id number]  HPI Cortez is in for her routine visit. She is happy that her back wound continues to heal. She states that it is "getting harder to remember to take her antibiotic". She estimates that she's only been taking about 50% of her doses. She has not had any recurrence of her diarrhea.    Review of Systems     Objective:   Physical Exam  Constitutional: No distress.  Musculoskeletal:       Her lumbar incision is indeed looking much better. It is now about 1-1/2 cm wide by 3-1/2 cm long and only about half a centimeter deep. There is 100% pink granulation tissue and no drainage.          Assessment & Plan:   No problem-specific assessment & plan notes found for this encounter.

## 2011-06-18 ENCOUNTER — Ambulatory Visit: Payer: Medicaid Other | Admitting: Internal Medicine

## 2012-06-05 IMAGING — CR DG CHEST 1V PORT
1 series · 1 of 1 positions shown · non-contrast
Comparison: Chest radiograph performed 02/27/2011

CLINICAL DATA: Persistent dry cough.

PORTABLE CHEST - 1 VIEW

[AP]
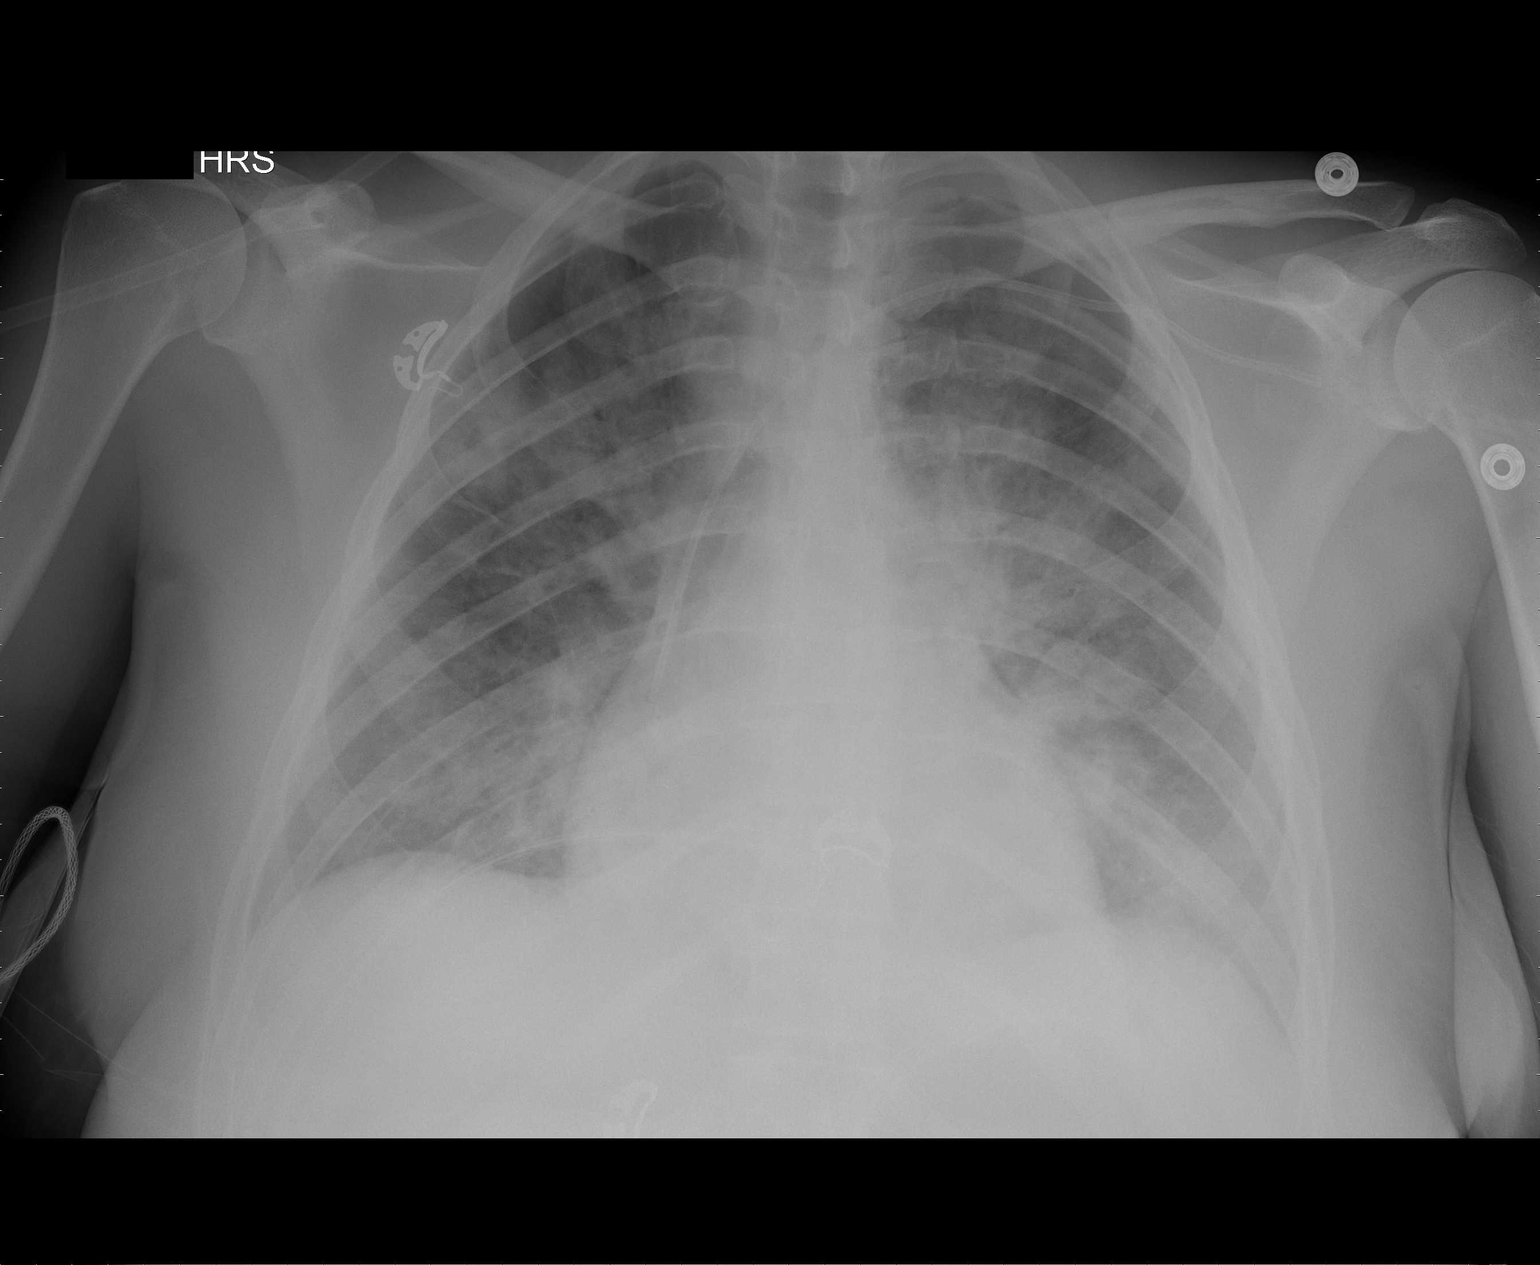

[1 of 1 positions shown; findings below may reference images not displayed]

FINDINGS: The lungs are well-aerated.  Vascular congestion is
noted, with bibasilar and perihilar airspace opacification,
compatible with noncardiogenic pulmonary edema.  There is no
evidence of pleural effusion or pneumothorax.

The cardiomediastinal silhouette is normal in size.  The patient's
left subclavian line is noted ending at the cavoatrial junction.
No acute osseous abnormalities are seen.
IMPRESSION: Vascular congestion, with bibasilar and perihilar airspace
opacification, compatible with noncardiogenic pulmonary edema.  The
distribution is mildly atypical for ARDS.

## 2012-06-06 IMAGING — CT CT HEAD W/O CM
1 of 2 series · 13 of 30 positions shown, 17 images · non-contrast
Comparison: 02/28/2011

CLINICAL DATA: Altered mental status.  Sepsis.

CT HEAD WITHOUT CONTRAST
TECHNIQUE: Contiguous axial images were obtained from the base of
the skull through the vertex without contrast.

[Series 2: brain · axial · 0.47mm/px · z∈[+80,+202]mm · 13 of 28 slices shown, 17 images]
[im 2/28  brain]
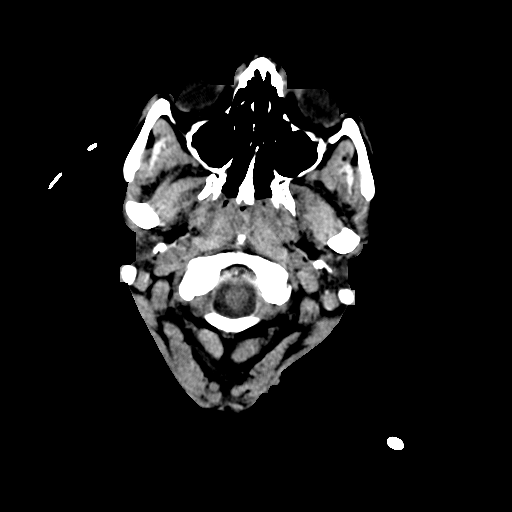
[im 2/28  bone]
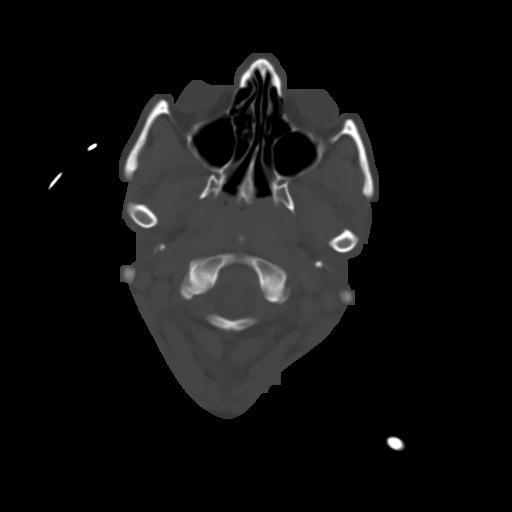
[im 4/28  brain]
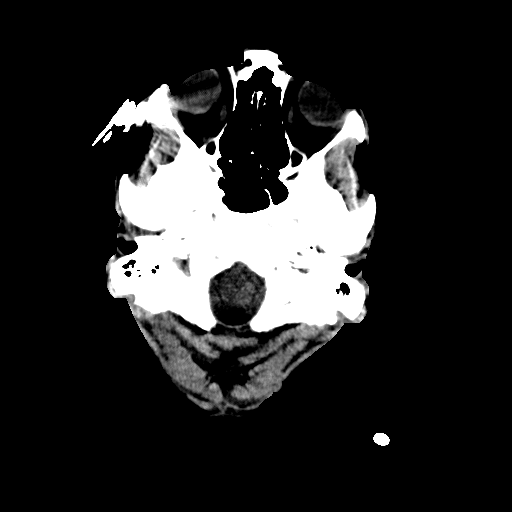
[im 6/28  brain]
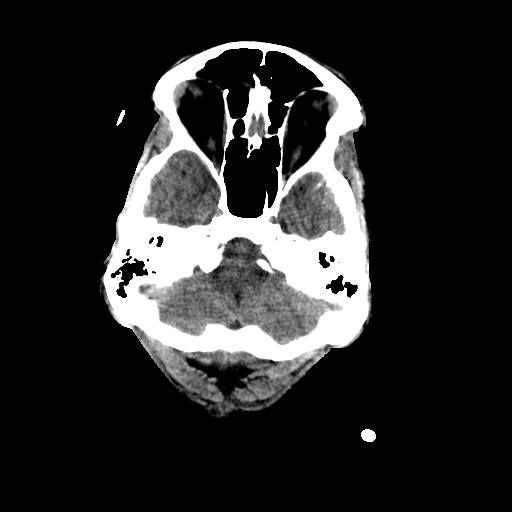
[im 8/28  brain]
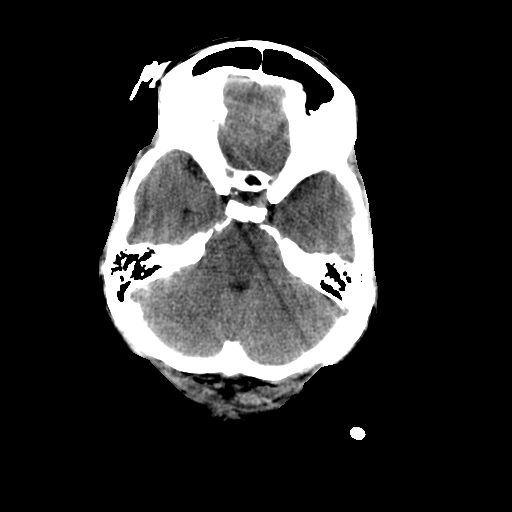
[im 10/28  brain]
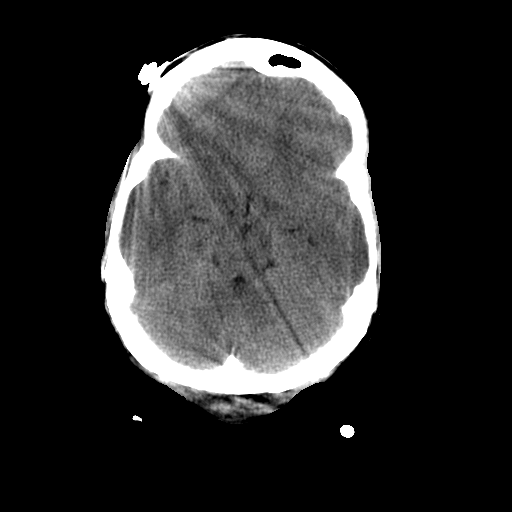
[im 10/28  bone]
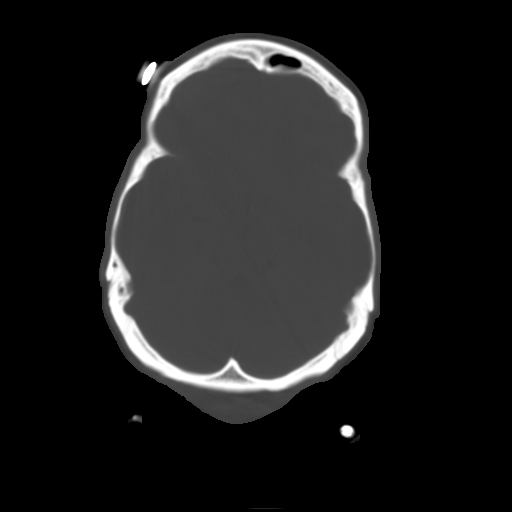
[im 12/28  brain]
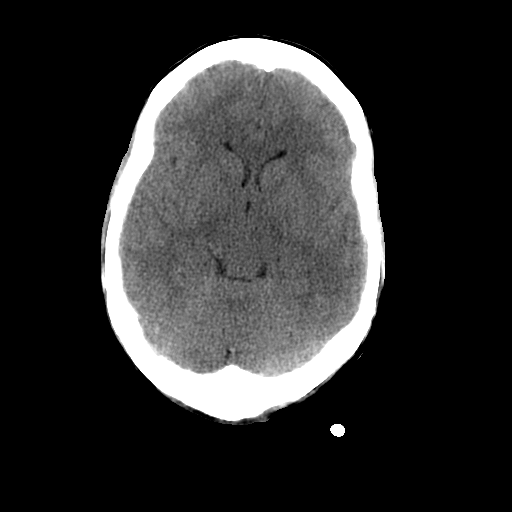
[im 14/28  brain]
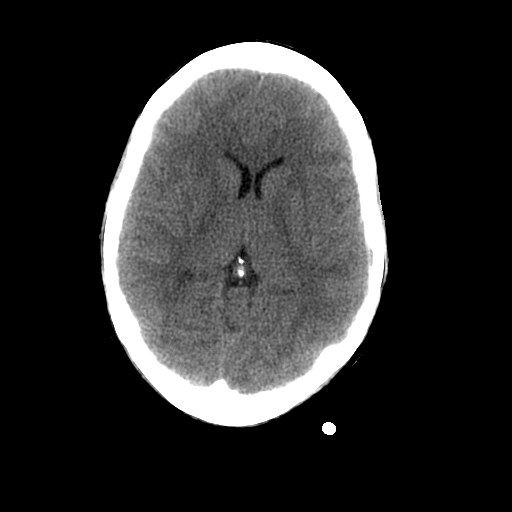
[im 16/28  brain]
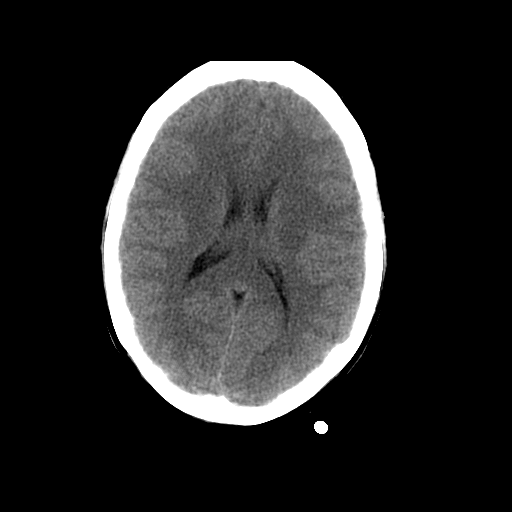
[im 18/28  brain]
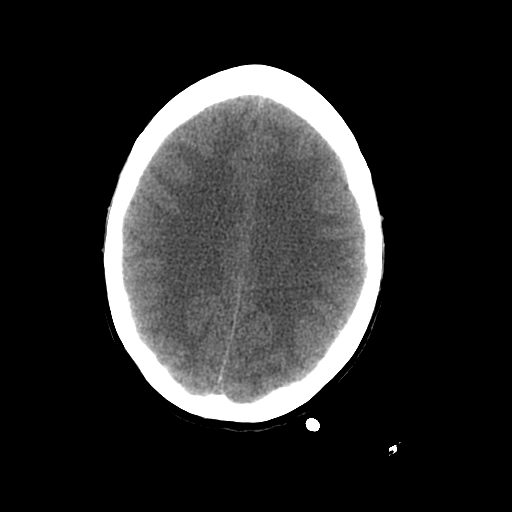
[im 18/28  bone]
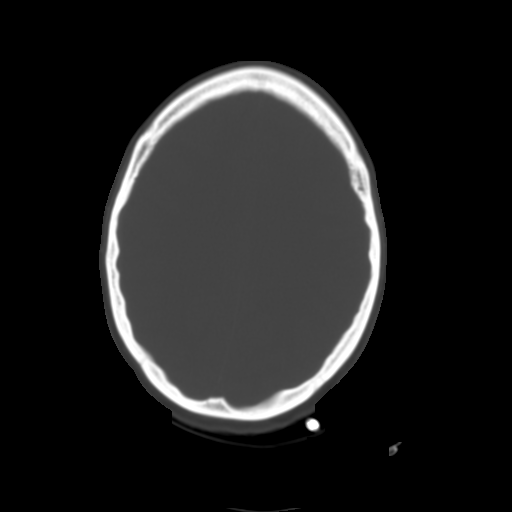
[im 20/28  brain]
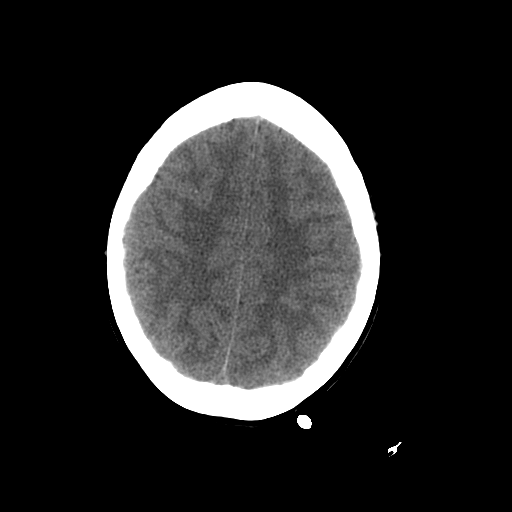
[im 22/28  brain]
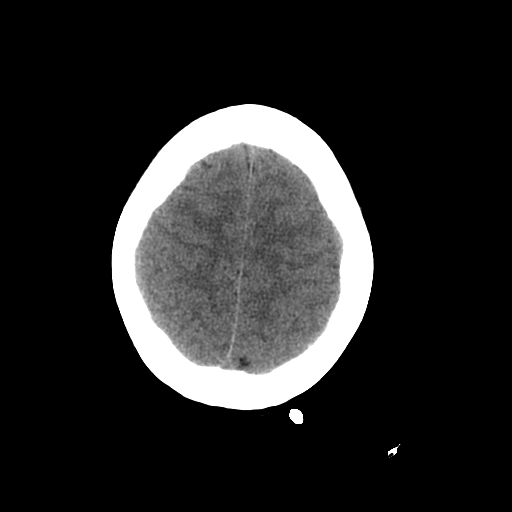
[im 24/28  brain]
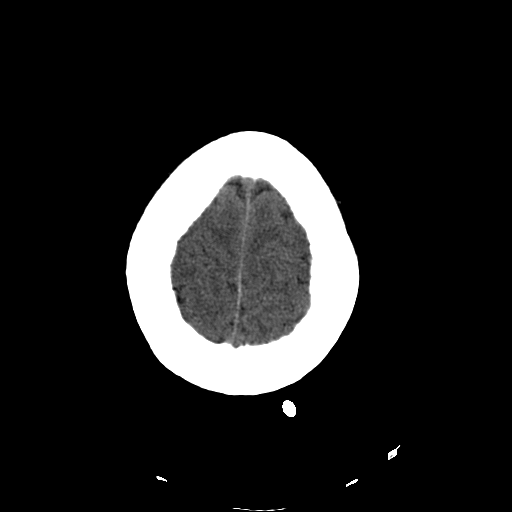
[im 26/28  brain]
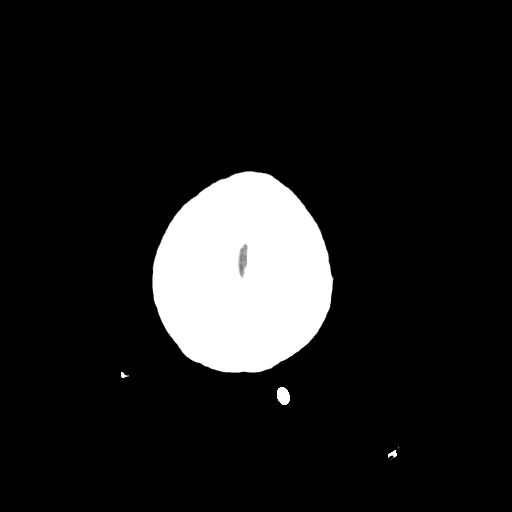
[im 26/28  bone]
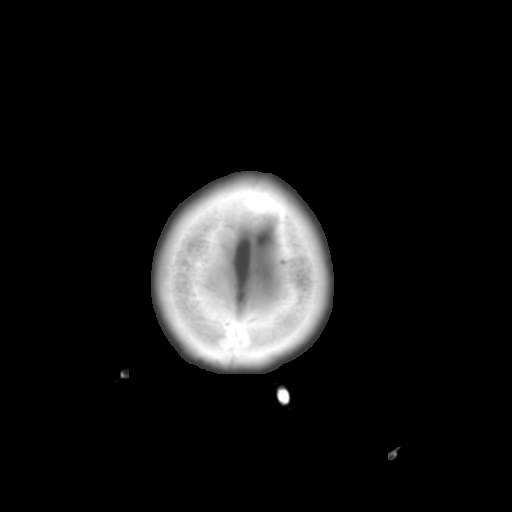

[13 of 30 positions shown; findings below may reference images not displayed]

FINDINGS: There is no acute intracranial infarction, hemorrhage, or
mass.  No subarachnoid or subdural hemorrhage.  The ventricles are
normal.

The lack of cortical sulci suggests the possibility of mild brain
edema.
IMPRESSION: Possible mild brain edema.

## 2012-06-07 IMAGING — CR DG CHEST 1V PORT
1 series · 1 of 1 positions shown · non-contrast
Comparison: 03/01/2011.

CLINICAL DATA: Pulmonary edema on ventilator.

PORTABLE CHEST - 1 VIEW

[view not recorded]
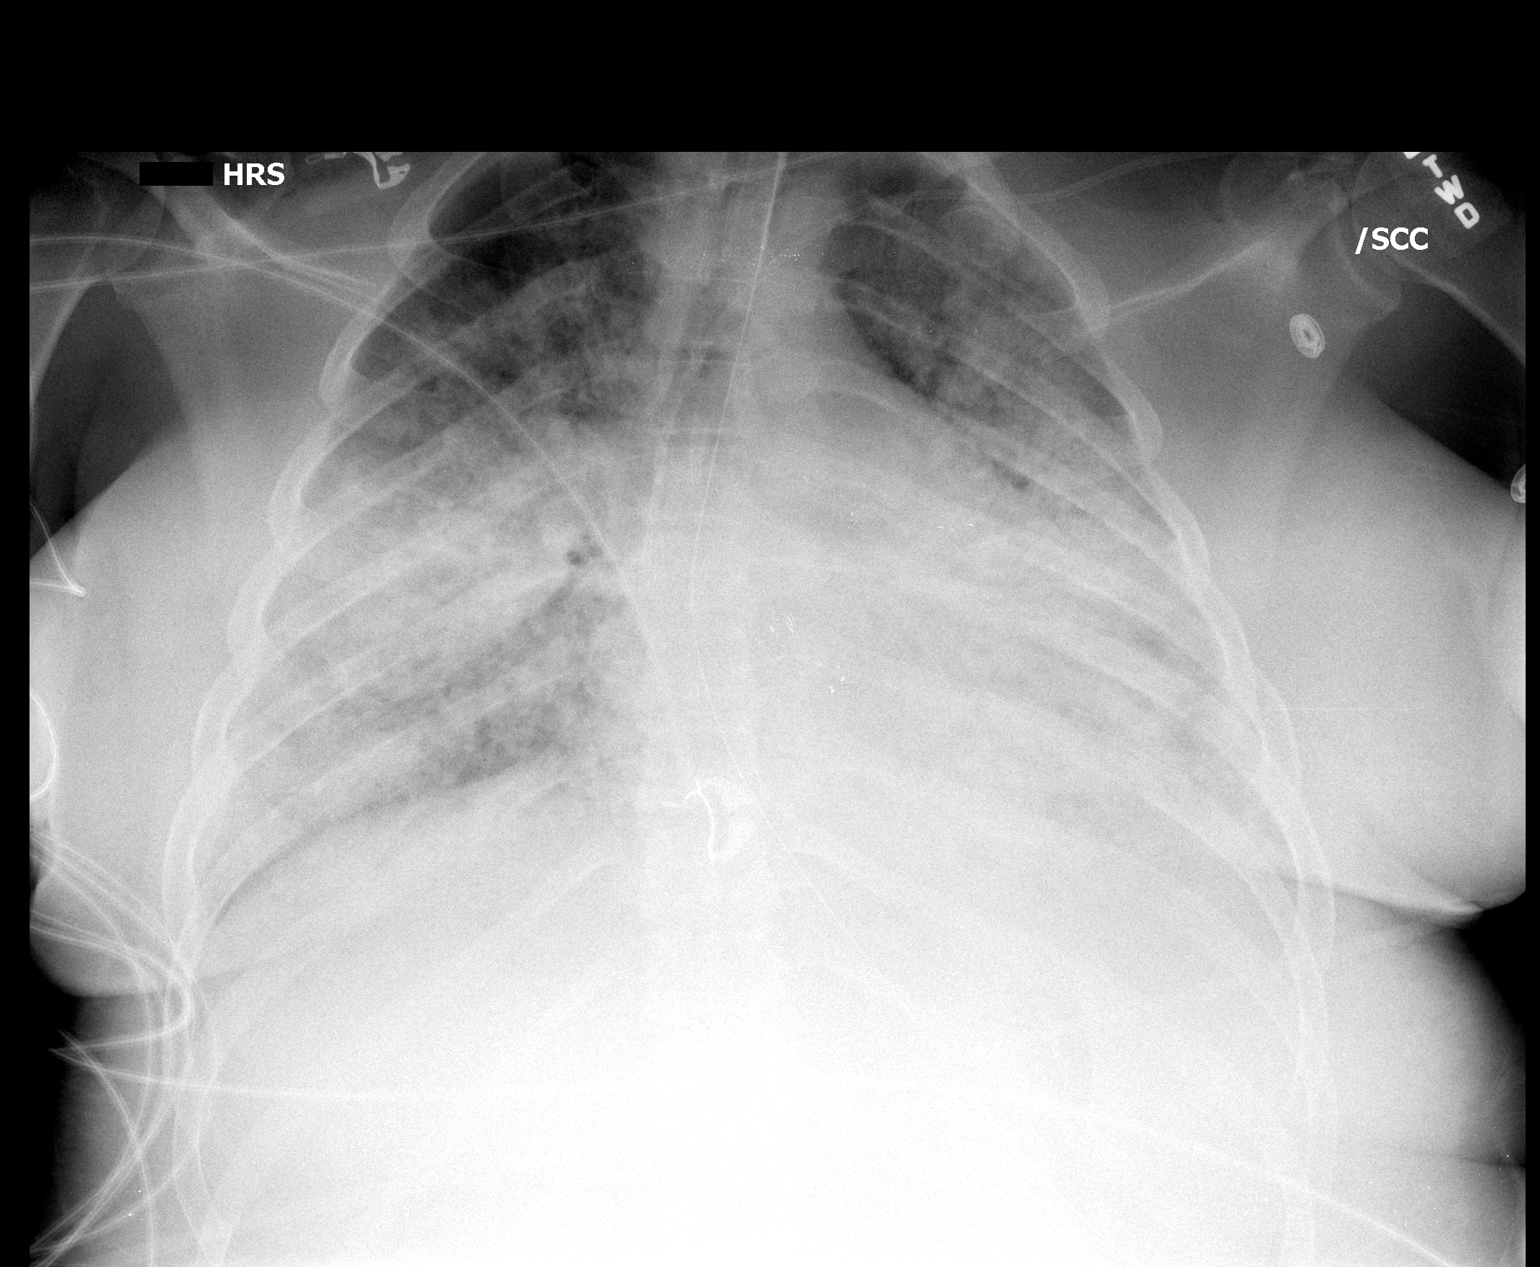

[1 of 1 positions shown; findings below may reference images not displayed]

FINDINGS: Endotracheal tube tip 5 cm above the carina.  Left
central line tip mid to distal superior vena cava. Nasogastric tube
courses below the diaphragm.  The tip is not included on this
exam.

No gross pneumothorax.  Diffuse air space disease with very minimal
spurring of the right lung apex.  Appearance of ARDS.  Clinical
correlation recommended.  Infection not excluded although this may
represent pulmonary edema.

Cardiomegaly.  Limit evaluation mediastinum given the air space
disease.
IMPRESSION: Diffuse air space disease remains without improvement.

## 2013-05-24 ENCOUNTER — Other Ambulatory Visit: Payer: Self-pay | Admitting: Neurosurgery

## 2013-05-24 DIAGNOSIS — M5416 Radiculopathy, lumbar region: Secondary | ICD-10-CM

## 2013-06-09 ENCOUNTER — Inpatient Hospital Stay: Admission: RE | Admit: 2013-06-09 | Payer: Medicaid Other | Source: Ambulatory Visit

## 2013-06-09 ENCOUNTER — Inpatient Hospital Stay
Admission: RE | Admit: 2013-06-09 | Discharge: 2013-06-09 | Disposition: A | Discharge status: Home / Self Care | Payer: Medicaid Other | Source: Ambulatory Visit | Attending: Neurosurgery | Admitting: Neurosurgery

## 2013-06-09 ENCOUNTER — Other Ambulatory Visit: Payer: Medicaid Other

## 2013-07-10 ENCOUNTER — Inpatient Hospital Stay: Admission: RE | Admit: 2013-07-10 | Payer: Medicaid Other | Source: Ambulatory Visit

## 2013-07-10 ENCOUNTER — Inpatient Hospital Stay
Admission: RE | Admit: 2013-07-10 | Discharge: 2013-07-10 | Disposition: A | Discharge status: Home / Self Care | Payer: Medicaid Other | Source: Ambulatory Visit | Attending: Neurosurgery | Admitting: Neurosurgery

## 2013-07-10 ENCOUNTER — Other Ambulatory Visit: Payer: Medicaid Other

## 2013-08-02 ENCOUNTER — Ambulatory Visit
Admission: RE | Admit: 2013-08-02 | Discharge: 2013-08-02 | Disposition: A | Discharge status: Home / Self Care | Payer: Medicaid Other | Source: Ambulatory Visit | Attending: Neurosurgery | Admitting: Neurosurgery

## 2013-08-02 VITALS — BP 86/54 | HR 74

## 2013-08-02 DIAGNOSIS — M5416 Radiculopathy, lumbar region: Secondary | ICD-10-CM

## 2013-08-02 DIAGNOSIS — IMO0002 Reserved for concepts with insufficient information to code with codable children: Secondary | ICD-10-CM

## 2013-08-02 MED ORDER — OXYCODONE-ACETAMINOPHEN 5-325 MG PO TABS
2.0000 | ORAL_TABLET | Freq: Once | ORAL | Status: AC
  Administered 2013-08-02: 2 via ORAL

## 2013-08-02 MED ORDER — IOHEXOL 300 MG/ML  SOLN
10.0000 mL | Freq: Once | INTRAMUSCULAR | Status: AC | PRN
  Administered 2013-08-02: 10 mL via INTRATHECAL

## 2013-08-02 MED ORDER — MEPERIDINE HCL 100 MG/ML IJ SOLN
100.0000 mg | Freq: Once | INTRAMUSCULAR | Status: AC
  Administered 2013-08-02: 100 mg via INTRAMUSCULAR

## 2013-08-02 MED ORDER — DIAZEPAM 5 MG PO TABS
10.0000 mg | ORAL_TABLET | Freq: Once | ORAL | Status: AC
  Administered 2013-08-02: 10 mg via ORAL

## 2013-08-02 MED ORDER — ONDANSETRON HCL 4 MG/2ML IJ SOLN
4.0000 mg | Freq: Once | INTRAMUSCULAR | Status: AC
  Administered 2013-08-02: 4 mg via INTRAMUSCULAR

## 2013-08-02 NOTE — Progress Notes (Signed)
LMOM with Devona Konig with Dr. Jeral Fruit that patient is here for myelogram and that she is out of Roxicodone 15mg  PO q 4 hours prn.  Recording states prescriptions may take up to 24 hours.  I told Susy Frizzle I would pass on that data to patient but that she would probably send her friend/driver, Riki Rusk, in to office while she reclined in car s/p myelogram.  jkl

## 2015-01-07 ENCOUNTER — Emergency Department (HOSPITAL_COMMUNITY): Payer: Medicaid Other

## 2015-01-07 ENCOUNTER — Encounter (HOSPITAL_COMMUNITY): Payer: Self-pay

## 2015-01-07 ENCOUNTER — Emergency Department (HOSPITAL_COMMUNITY)
Admission: EM | Admit: 2015-01-07 | Discharge: 2015-01-07 | Disposition: A | Discharge status: Home / Self Care | Payer: Medicaid Other | Attending: Emergency Medicine | Admitting: Emergency Medicine

## 2015-01-07 DIAGNOSIS — R509 Fever, unspecified: Secondary | ICD-10-CM | POA: Insufficient documentation

## 2015-01-07 DIAGNOSIS — Z859 Personal history of malignant neoplasm, unspecified: Secondary | ICD-10-CM | POA: Diagnosis not present

## 2015-01-07 DIAGNOSIS — Y9289 Other specified places as the place of occurrence of the external cause: Secondary | ICD-10-CM | POA: Diagnosis not present

## 2015-01-07 DIAGNOSIS — G8929 Other chronic pain: Secondary | ICD-10-CM

## 2015-01-07 DIAGNOSIS — Z79899 Other long term (current) drug therapy: Secondary | ICD-10-CM | POA: Diagnosis not present

## 2015-01-07 DIAGNOSIS — Z720 Tobacco use: Secondary | ICD-10-CM | POA: Diagnosis not present

## 2015-01-07 DIAGNOSIS — Z8659 Personal history of other mental and behavioral disorders: Secondary | ICD-10-CM | POA: Diagnosis not present

## 2015-01-07 DIAGNOSIS — S3992XA Unspecified injury of lower back, initial encounter: Secondary | ICD-10-CM | POA: Insufficient documentation

## 2015-01-07 DIAGNOSIS — Y9389 Activity, other specified: Secondary | ICD-10-CM | POA: Insufficient documentation

## 2015-01-07 DIAGNOSIS — M549 Dorsalgia, unspecified: Secondary | ICD-10-CM

## 2015-01-07 DIAGNOSIS — Z8639 Personal history of other endocrine, nutritional and metabolic disease: Secondary | ICD-10-CM | POA: Diagnosis not present

## 2015-01-07 DIAGNOSIS — Y998 Other external cause status: Secondary | ICD-10-CM | POA: Diagnosis not present

## 2015-01-07 DIAGNOSIS — W108XXA Fall (on) (from) other stairs and steps, initial encounter: Secondary | ICD-10-CM | POA: Diagnosis not present

## 2015-01-07 DIAGNOSIS — Z8619 Personal history of other infectious and parasitic diseases: Secondary | ICD-10-CM | POA: Insufficient documentation

## 2015-01-07 DIAGNOSIS — W19XXXA Unspecified fall, initial encounter: Secondary | ICD-10-CM

## 2015-01-07 HISTORY — DX: Anxiety disorder, unspecified: F41.9

## 2015-01-07 HISTORY — DX: Malignant (primary) neoplasm, unspecified: C80.1

## 2015-01-07 HISTORY — DX: Unspecified abnormalities of heart beat: R00.9

## 2015-01-07 HISTORY — DX: Hypocalcemia: E83.51

## 2015-01-07 HISTORY — DX: Bipolar disorder, unspecified: F31.9

## 2015-01-07 MED ORDER — KETOROLAC TROMETHAMINE 60 MG/2ML IM SOLN
60.0000 mg | Freq: Once | INTRAMUSCULAR | Status: AC
  Administered 2015-01-07: 60 mg via INTRAMUSCULAR
  Filled 2015-01-07: qty 2

## 2015-01-07 MED ORDER — DIAZEPAM 5 MG PO TABS
5.0000 mg | ORAL_TABLET | Freq: Once | ORAL | Status: AC
  Administered 2015-01-07: 5 mg via ORAL
  Filled 2015-01-07: qty 1

## 2015-01-07 NOTE — ED Provider Notes (Signed)
CSN: [id number]     Arrival date & time 01/07/15  5053 History  This chart was scribed for non-physician practitioner, Al Corpus, PA-C, working with Orpah Greek, MD, by Jeanell Sparrow, ED Scribe. This patient was seen in room TR08C/TR08C and the patient's care was started at 8:28 PM.  Chief Complaint  Patient presents with  . Back Pain   The history is provided by the patient. No language interpreter was used.   HPI Comments: Gloria Hawkins is a 34 y.o. female who presents to the Emergency Department complaining of constant moderate lower back pain that started 2 days ago. She states that she fell down some stairs and fell off a couch 2 days ago. She describes the pain as a stabbing and throbbing sensation. She states that the pain is similar to prior episodes, except it is now worse. She reports that movement exacerbates the pain. She reports having intermittent chills and subjective fever. She states that she usually has chronic numbness and tingling in her LE. She states that she has a hx of cervical cancer and a hysterectomy in 2004. She reports her last use of IV drugs was 3 years ago. She denies any new numbness, tingling, weakness, abdominal pain or dysuria.   Past Medical History  Diagnosis Date  . Clostridium difficile enterocolitis 02/26/2011  . Cancer   . Anxiety   . Manic depression   . Hypocalcemia   . Abnormal heart rate    Past Surgical History  Procedure Laterality Date  . Back surgery      fusion  . Abdominal hysterectomy     No family history on file. History  Substance Use Topics  . Smoking status: Current Every Day Smoker -- 0.50 packs/day for 11 years    Types: Cigarettes  . Smokeless tobacco: Never Used  . Alcohol Use: No   OB History    No data available     Review of Systems  Constitutional: Positive for fever and chills.  Gastrointestinal: Negative for abdominal pain.  Musculoskeletal: Positive for myalgias and back pain.   Neurological: Negative for weakness and numbness.      Allergies  Codeine; Fentanyl; Hydrocodone-acetaminophen; Morphine; and Prednisone  Home Medications   Prior to Admission medications   Medication Sig Start Date End Date Taking? Authorizing Provider  B Complex-C-Folic Acid (MULTIVITAMIN, STRESS FORMULA) tablet Take 1 tablet by mouth daily.      Historical Provider, MD  oxyCODONE (ROXICODONE) 15 MG immediate release tablet Take 15 mg by mouth every 4 (four) hours as needed.     Historical Provider, MD  Probiotic Product (PROBIOTIC FORMULA) CAPS Take by mouth.      Historical Provider, MD   BP 101/65 mmHg  Pulse 96  Temp(Src) 98 F (36.7 C) (Oral)  Resp 16  SpO2 100% Physical Exam  Constitutional: She appears well-developed and well-nourished. No distress.  HENT:  Head: Normocephalic and atraumatic.  Eyes: Conjunctivae are normal. Right eye exhibits no discharge. Left eye exhibits no discharge.  Cardiovascular: Normal rate, regular rhythm and normal heart sounds.   Pulmonary/Chest: Effort normal and breath sounds normal. No respiratory distress. She has no wheezes.  Abdominal: Soft. Bowel sounds are normal. She exhibits no distension. There is no tenderness.  Musculoskeletal:  No midline back tenderness, step off or crepitus. Pt with bilateral paraspinal tenderness. Midline surgical scar. Right and Left sided lower back tenderness. No CVA tenderness. +2 pedal pulses bilaterally.    Neurological: She is alert. Coordination normal.  Equal  muscle tone. 5/5 strength in lower extremities. DTR equal and intact. Negative r straight leg test. Positive left straight leg test. Antalgic gait.   Skin: Skin is warm and dry. She is not diaphoretic.  Nursing note and vitals reviewed.   ED Course  Procedures (including critical care time) DIAGNOSTIC STUDIES: Oxygen Saturation is 100% on RA, normal by my interpretation.    COORDINATION OF CARE: 8:32 PM- Pt advised of plan for  treatment which includes medication and radiology and pt agrees.  Labs Review Labs Reviewed - No data to display  Imaging Review Dg Lumbar Spine Complete  01/07/2015   CLINICAL DATA:  Low back pain status post fall down the stairs.  EXAM: LUMBAR SPINE - COMPLETE 4+ VIEW  COMPARISON:  CT 08/02/2013  FINDINGS: L4-5 lumbar spinal fusion hardware is demonstrated and appears intact. Normal anatomic alignment. Preservation of the vertebral body heights. No definite evidence for acute displaced fracture. SI joints are unremarkable. Cholecystectomy clips.  IMPRESSION: Lumbar spinal fusion hardware L4-5 appears intact.  No definite evidence for acute osseous abnormality.   Electronically Signed   By: Lovey Newcomer M.D.   On: 01/07/2015 21:31     EKG Interpretation None      MDM   Final diagnoses:  Chronic back pain  Fall, initial encounter   Patient with back pain. No loss of bowel or bladder control. No saddle anesthesia. No fever, night sweats, weight loss, h/o cancer, IVDU. VSS. No neurological deficits except for positive left straight leg raise.  Patient ambulatory. No concern for cauda equina.  X-ray without acute changes of hardware. RICE protocol discussed. Pain managed in ED. No outpatient prescription for narcotics provided. Patient is afebrile, nontoxic, and in no acute distress. Patient is appropriate for outpatient management and is stable for discharge. Patient with right home.  Discussed return precautions with patient. Discussed all results and patient verbalizes understanding and agrees with plan.  I personally performed the services described in this documentation, which was scribed in my presence. The recorded information has been reviewed and is accurate.   Al Corpus, PA-C 01/07/15 [place], MD 01/08/15 [phone]

## 2015-01-07 NOTE — Discharge Instructions (Signed)
Return to the emergency room with worsening of symptoms, new symptoms or with symptoms that are concerning , especially fevers, loss of control of bladder or bowels, numbness or tingling around genital region or anus, weakness. RICE: Rest, Ice (three cycles of 20 mins on, 89mns off at least twice a day), compression/brace, elevation. Heating pad works well for back pain. Ibuprofen 4075m(2 tablets 20013mevery 5-6 hours for 3-5 days. Follow up with PCP/orthopedist if symptoms worsen or are persistent. Read below information and follow recommendations.  Back Injury Prevention Back injuries can be extremely painful and difficult to heal. After having one back injury, you are much more likely to experience another later on. It is important to learn how to avoid injuring or re-injuring your back. The following tips can help you to prevent a back injury. PHYSICAL FITNESS  Exercise regularly and try to develop good tone in your abdominal muscles. Your abdominal muscles provide a lot of the support needed by your back.  Do aerobic exercises (walking, jogging, biking, swimming) regularly.  Do exercises that increase balance and strength (tai chi, yoga) regularly. This can decrease your risk of falling and injuring your back.  Stretch before and after exercising.  Maintain a healthy weight. The more you weigh, the more stress is placed on your back. For every pound of weight, 10 times that amount of pressure is placed on the back. DIET  Talk to your caregiver about how much calcium and vitamin D you need per day. These nutrients help to prevent weakening of the bones (osteoporosis). Osteoporosis can cause broken (fractured) bones that lead to back pain.  Include good sources of calcium in your diet, such as dairy products, green, leafy vegetables, and products with calcium added (fortified).  Include good sources of vitamin D in your diet, such as milk and foods that are fortified with vitamin  D.  Consider taking a nutritional supplement or a multivitamin if needed.  Stop smoking if you smoke. POSTURE  Sit and stand up straight. Avoid leaning forward when you sit or hunching over when you stand.  Choose chairs with good low back (lumbar) support.  If you work at a desk, sit close to your work so you do not need to lean over. Keep your chin tucked in. Keep your neck drawn back and elbows bent at a right angle. Your arms should look like the letter "L."  Sit high and close to the steering wheel when you drive. Add a lumbar support to your car seat if needed.  Avoid sitting or standing in one position for too long. Take breaks to get up, stretch, and walk around at least once every hour. Take breaks if you are driving for long periods of time.  Sleep on your side with your knees slightly bent, or sleep on your back with a pillow under your knees. Do not sleep on your stomach. LIFTING, TWISTING, AND REACHING  Avoid heavy lifting, especially repetitive lifting. If you must do heavy lifting:  Stretch before lifting.  Work slowly.  Rest between lifts.  Use carts and dollies to move objects when possible.  Make several small trips instead of carrying 1 heavy load.  Ask for help when you need it.  Ask for help when moving big, awkward objects.  Follow these steps when lifting:  Stand with your feet shoulder-width apart.  Get as close to the object as you can. Do not try to pick up heavy objects that are far from your body.  Use handles or lifting straps if they are available.  Bend at your knees. Squat down, but keep your heels off the floor.  Keep your shoulders pulled back, your chin tucked in, and your back straight.  Lift the object slowly, tightening the muscles in your legs, abdomen, and buttocks. Keep the object as close to the center of your body as possible.  When you put a load down, use these same guidelines in reverse.  Do not:  Lift the object  above your waist.  Twist at the waist while lifting or carrying a load. Move your feet if you need to turn, not your waist.  Bend over without bending at your knees.  Avoid reaching over your head, across a table, or for an object on a high surface. OTHER TIPS  Avoid wet floors and keep sidewalks clear of ice to prevent falls.  Do not sleep on a mattress that is too soft or too hard.  Keep items that are used frequently within easy reach.  Put heavier objects on shelves at waist level and lighter objects on lower or higher shelves.  Find ways to decrease your stress, such as exercise, massage, or relaxation techniques. Stress can build up in your muscles. Tense muscles are more vulnerable to injury.  Seek treatment for depression or anxiety if needed. These conditions can increase your risk of developing back pain. SEEK MEDICAL CARE IF:  You injure your back.  You have questions about diet, exercise, or other ways to prevent back injuries. MAKE SURE YOU:  Understand these instructions.  Will watch your condition.  Will get help right away if you are not doing well or get worse. Document Released: 10/29/2004 Document Revised: 12/14/2011 Document Reviewed: 11/02/2011 [facility] Patient Information 2015 [place], [place]. This information is not intended to replace advice given to you by your health care provider. Make sure you discuss any questions you have with your health care provider.

## 2015-01-07 NOTE — ED Notes (Signed)
Pt stable, ambulatory, pain 10/10, friend at bedside

## 2015-01-07 NOTE — ED Notes (Signed)
Pt. Reports low back pain since fall on Saturday. States hx of back surgery. Reports numbness in legs but states that is normal for patient, denies increases in numbness.

## 2016-01-04 DEATH — deceased
